# Patient Record
Sex: Female | Born: 1935 | Race: White | Hispanic: No | Smoking: Never smoker
Health system: Southern US, Community
[De-identification: ages and names within clinical notes are randomized; demographics above are authoritative.]

## PROBLEM LIST (undated history)

## (undated) DIAGNOSIS — I4821 Permanent atrial fibrillation: Secondary | ICD-10-CM

## (undated) DIAGNOSIS — I10 Essential (primary) hypertension: Secondary | ICD-10-CM

## (undated) DIAGNOSIS — L97909 Non-pressure chronic ulcer of unspecified part of unspecified lower leg with unspecified severity: Secondary | ICD-10-CM

## (undated) DIAGNOSIS — N289 Disorder of kidney and ureter, unspecified: Secondary | ICD-10-CM

## (undated) DIAGNOSIS — M1990 Unspecified osteoarthritis, unspecified site: Secondary | ICD-10-CM

## (undated) DIAGNOSIS — R001 Bradycardia, unspecified: Secondary | ICD-10-CM

## (undated) DIAGNOSIS — F0390 Unspecified dementia without behavioral disturbance: Secondary | ICD-10-CM

## (undated) DIAGNOSIS — Z87442 Personal history of urinary calculi: Secondary | ICD-10-CM

## (undated) DIAGNOSIS — I83009 Varicose veins of unspecified lower extremity with ulcer of unspecified site: Secondary | ICD-10-CM

## (undated) DIAGNOSIS — I5189 Other ill-defined heart diseases: Secondary | ICD-10-CM

## (undated) DIAGNOSIS — I2510 Atherosclerotic heart disease of native coronary artery without angina pectoris: Secondary | ICD-10-CM

## (undated) DIAGNOSIS — K5900 Constipation, unspecified: Secondary | ICD-10-CM

## (undated) HISTORY — DX: Unspecified dementia, unspecified severity, without behavioral disturbance, psychotic disturbance, mood disturbance, and anxiety: F03.90

## (undated) HISTORY — PX: CHOLECYSTECTOMY: SHX55

## (undated) HISTORY — DX: Varicose veins of unspecified lower extremity with ulcer of unspecified site: I83.009

## (undated) HISTORY — DX: Permanent atrial fibrillation: I48.21

## (undated) HISTORY — DX: Bradycardia, unspecified: R00.1

## (undated) HISTORY — DX: Unspecified osteoarthritis, unspecified site: M19.90

## (undated) HISTORY — PX: ABDOMINAL HYSTERECTOMY: SHX81

## (undated) HISTORY — PX: LAMINECTOMY: SHX219

## (undated) HISTORY — DX: Non-pressure chronic ulcer of unspecified part of unspecified lower leg with unspecified severity: L97.909

## (undated) HISTORY — PX: APPENDECTOMY: SHX54

## (undated) HISTORY — DX: Other ill-defined heart diseases: I51.89

---

## 1998-09-06 ENCOUNTER — Emergency Department (HOSPITAL_COMMUNITY): Admission: EM | Admit: 1998-09-06 | Discharge: 1998-09-06 | Payer: Self-pay | Admitting: *Deleted

## 1999-01-15 ENCOUNTER — Encounter: Payer: Self-pay | Admitting: Family Medicine

## 1999-01-15 ENCOUNTER — Encounter: Admission: RE | Admit: 1999-01-15 | Discharge: 1999-01-15 | Payer: Self-pay | Admitting: Family Medicine

## 2001-06-14 ENCOUNTER — Encounter: Admission: RE | Admit: 2001-06-14 | Discharge: 2001-06-14 | Payer: Self-pay | Admitting: Cardiology

## 2001-06-14 ENCOUNTER — Ambulatory Visit (HOSPITAL_COMMUNITY): Admission: RE | Admit: 2001-06-14 | Discharge: 2001-06-14 | Payer: Self-pay | Admitting: Cardiology

## 2001-06-15 ENCOUNTER — Ambulatory Visit (HOSPITAL_COMMUNITY): Admission: RE | Admit: 2001-06-15 | Discharge: 2001-06-16 | Payer: Self-pay | Admitting: Cardiology

## 2001-06-19 ENCOUNTER — Emergency Department (HOSPITAL_COMMUNITY): Admission: EM | Admit: 2001-06-19 | Discharge: 2001-06-19 | Payer: Self-pay

## 2001-06-19 ENCOUNTER — Encounter: Payer: Self-pay | Admitting: Emergency Medicine

## 2001-11-04 ENCOUNTER — Ambulatory Visit (HOSPITAL_COMMUNITY): Admission: RE | Admit: 2001-11-04 | Discharge: 2001-11-04 | Payer: Self-pay | Admitting: Cardiology

## 2001-11-30 ENCOUNTER — Ambulatory Visit (HOSPITAL_COMMUNITY): Admission: RE | Admit: 2001-11-30 | Discharge: 2001-11-30 | Payer: Self-pay | Admitting: Cardiology

## 2001-11-30 ENCOUNTER — Encounter: Payer: Self-pay | Admitting: Cardiology

## 2003-06-22 ENCOUNTER — Encounter: Admission: RE | Admit: 2003-06-22 | Discharge: 2003-06-22 | Payer: Self-pay | Admitting: Family Medicine

## 2004-03-20 ENCOUNTER — Ambulatory Visit (HOSPITAL_COMMUNITY): Admission: RE | Admit: 2004-03-20 | Discharge: 2004-03-20 | Payer: Self-pay | Admitting: Cardiology

## 2004-04-03 ENCOUNTER — Inpatient Hospital Stay (HOSPITAL_COMMUNITY): Admission: RE | Admit: 2004-04-03 | Discharge: 2004-04-22 | Payer: Self-pay | Admitting: Cardiothoracic Surgery

## 2004-04-03 ENCOUNTER — Ambulatory Visit: Payer: Self-pay | Admitting: Physical Medicine & Rehabilitation

## 2004-04-03 ENCOUNTER — Encounter (INDEPENDENT_AMBULATORY_CARE_PROVIDER_SITE_OTHER): Payer: Self-pay | Admitting: Specialist

## 2004-04-03 HISTORY — PX: MITRAL VALVE REPLACEMENT: SHX147

## 2004-04-11 HISTORY — PX: PACEMAKER INSERTION: SHX728

## 2004-04-19 ENCOUNTER — Encounter (INDEPENDENT_AMBULATORY_CARE_PROVIDER_SITE_OTHER): Payer: Self-pay | Admitting: Cardiology

## 2004-10-14 ENCOUNTER — Encounter: Admission: RE | Admit: 2004-10-14 | Discharge: 2004-10-14 | Payer: Self-pay | Admitting: Family Medicine

## 2005-03-12 ENCOUNTER — Ambulatory Visit (HOSPITAL_COMMUNITY): Admission: RE | Admit: 2005-03-12 | Discharge: 2005-03-12 | Payer: Self-pay | Admitting: Cardiology

## 2005-09-17 ENCOUNTER — Ambulatory Visit (HOSPITAL_COMMUNITY): Admission: RE | Admit: 2005-09-17 | Discharge: 2005-09-17 | Payer: Self-pay | Admitting: Cardiology

## 2006-03-10 ENCOUNTER — Ambulatory Visit (HOSPITAL_COMMUNITY): Admission: RE | Admit: 2006-03-10 | Discharge: 2006-03-10 | Payer: Self-pay | Admitting: Cardiology

## 2006-05-07 ENCOUNTER — Ambulatory Visit: Payer: Self-pay | Admitting: Critical Care Medicine

## 2006-05-08 ENCOUNTER — Ambulatory Visit: Payer: Self-pay | Admitting: Cardiology

## 2006-11-12 ENCOUNTER — Ambulatory Visit: Payer: Self-pay | Admitting: Cardiovascular Disease

## 2006-11-12 ENCOUNTER — Ambulatory Visit: Payer: Self-pay | Admitting: Oncology

## 2006-11-12 ENCOUNTER — Inpatient Hospital Stay (HOSPITAL_COMMUNITY): Admission: EM | Admit: 2006-11-12 | Discharge: 2006-11-18 | Payer: Self-pay | Admitting: Emergency Medicine

## 2006-11-13 ENCOUNTER — Encounter (INDEPENDENT_AMBULATORY_CARE_PROVIDER_SITE_OTHER): Payer: Self-pay | Admitting: Cardiovascular Disease

## 2006-11-16 ENCOUNTER — Ambulatory Visit: Payer: Self-pay | Admitting: Vascular Surgery

## 2006-11-20 ENCOUNTER — Emergency Department (HOSPITAL_COMMUNITY): Admission: EM | Admit: 2006-11-20 | Discharge: 2006-11-20 | Payer: Self-pay | Admitting: Emergency Medicine

## 2007-02-22 ENCOUNTER — Ambulatory Visit (HOSPITAL_COMMUNITY): Admission: RE | Admit: 2007-02-22 | Discharge: 2007-02-22 | Payer: Self-pay | Admitting: Cardiology

## 2007-02-28 ENCOUNTER — Inpatient Hospital Stay (HOSPITAL_COMMUNITY): Admission: EM | Admit: 2007-02-28 | Discharge: 2007-03-03 | Payer: Self-pay | Admitting: Emergency Medicine

## 2008-06-21 ENCOUNTER — Emergency Department (HOSPITAL_COMMUNITY): Admission: EM | Admit: 2008-06-21 | Discharge: 2008-06-21 | Payer: Self-pay | Admitting: Emergency Medicine

## 2008-06-22 ENCOUNTER — Observation Stay (HOSPITAL_COMMUNITY): Admission: EM | Admit: 2008-06-22 | Discharge: 2008-06-22 | Payer: Self-pay | Admitting: Emergency Medicine

## 2010-02-07 ENCOUNTER — Emergency Department (HOSPITAL_COMMUNITY): Admission: EM | Admit: 2010-02-07 | Discharge: 2009-09-08 | Payer: Self-pay | Admitting: Emergency Medicine

## 2010-03-24 ENCOUNTER — Encounter: Payer: Self-pay | Admitting: Cardiothoracic Surgery

## 2010-05-13 ENCOUNTER — Emergency Department (HOSPITAL_COMMUNITY): Payer: Medicare Other

## 2010-05-13 ENCOUNTER — Inpatient Hospital Stay (HOSPITAL_COMMUNITY)
Admission: EM | Admit: 2010-05-13 | Discharge: 2010-05-17 | DRG: 556 | Disposition: A | Discharge status: Skilled Nursing Facility | Payer: Medicare Other | Attending: Internal Medicine | Admitting: Internal Medicine

## 2010-05-13 DIAGNOSIS — M545 Low back pain, unspecified: Secondary | ICD-10-CM | POA: Diagnosis present

## 2010-05-13 DIAGNOSIS — Z7901 Long term (current) use of anticoagulants: Secondary | ICD-10-CM

## 2010-05-13 DIAGNOSIS — Z950 Presence of cardiac pacemaker: Secondary | ICD-10-CM

## 2010-05-13 DIAGNOSIS — T45515A Adverse effect of anticoagulants, initial encounter: Secondary | ICD-10-CM | POA: Diagnosis present

## 2010-05-13 DIAGNOSIS — I10 Essential (primary) hypertension: Secondary | ICD-10-CM | POA: Diagnosis present

## 2010-05-13 DIAGNOSIS — A498 Other bacterial infections of unspecified site: Secondary | ICD-10-CM | POA: Diagnosis present

## 2010-05-13 DIAGNOSIS — Z954 Presence of other heart-valve replacement: Secondary | ICD-10-CM

## 2010-05-13 DIAGNOSIS — G8929 Other chronic pain: Secondary | ICD-10-CM | POA: Diagnosis present

## 2010-05-13 DIAGNOSIS — M7981 Nontraumatic hematoma of soft tissue: Principal | ICD-10-CM | POA: Diagnosis present

## 2010-05-13 DIAGNOSIS — N390 Urinary tract infection, site not specified: Secondary | ICD-10-CM | POA: Diagnosis present

## 2010-05-13 DIAGNOSIS — D649 Anemia, unspecified: Secondary | ICD-10-CM | POA: Diagnosis present

## 2010-05-13 DIAGNOSIS — I4891 Unspecified atrial fibrillation: Secondary | ICD-10-CM | POA: Diagnosis present

## 2010-05-13 LAB — DIFFERENTIAL
Basophils Absolute: 0 10*3/uL (ref 0.0–0.1)
Eosinophils Absolute: 0.1 10*3/uL (ref 0.0–0.7)
Eosinophils Relative: 1 % (ref 0–5)
Lymphocytes Relative: 14 % (ref 12–46)
Monocytes Absolute: 0.9 10*3/uL (ref 0.1–1.0)

## 2010-05-13 LAB — URINALYSIS, ROUTINE W REFLEX MICROSCOPIC
Bilirubin Urine: NEGATIVE
Nitrite: POSITIVE — AB
Protein, ur: NEGATIVE mg/dL
Urobilinogen, UA: 0.2 mg/dL (ref 0.0–1.0)

## 2010-05-13 LAB — CBC
HCT: 30.5 % — ABNORMAL LOW (ref 36.0–46.0)
MCHC: 32.8 g/dL (ref 30.0–36.0)
MCV: 90 fL (ref 78.0–100.0)
Platelets: 177 10*3/uL (ref 150–400)
RDW: 14.9 % (ref 11.5–15.5)
WBC: 7.2 10*3/uL (ref 4.0–10.5)

## 2010-05-13 LAB — BASIC METABOLIC PANEL
BUN: 32 mg/dL — ABNORMAL HIGH (ref 6–23)
CO2: 23 mEq/L (ref 19–32)
Calcium: 8.9 mg/dL (ref 8.4–10.5)
GFR calc non Af Amer: 56 mL/min — ABNORMAL LOW (ref >60)
Glucose, Bld: 148 mg/dL — ABNORMAL HIGH (ref 70–99)
Sodium: 136 mEq/L (ref 135–145)

## 2010-05-14 LAB — CBC
HCT: 29.3 % — ABNORMAL LOW (ref 36.0–46.0)
HCT: 27 % — ABNORMAL LOW (ref 36.0–46.0)
Hemoglobin: 9.6 g/dL — ABNORMAL LOW (ref 12.0–15.0)
MCHC: 31.9 g/dL (ref 30.0–36.0)
Platelets: 154 10*3/uL (ref 150–400)
RBC: 3.22 MIL/uL — ABNORMAL LOW (ref 3.87–5.11)
RDW: 14.8 % (ref 11.5–15.5)
WBC: 5.4 10*3/uL (ref 4.0–10.5)
WBC: 6.2 10*3/uL (ref 4.0–10.5)

## 2010-05-14 LAB — PROTIME-INR
INR: 2.67 — ABNORMAL HIGH (ref 0.00–1.49)
Prothrombin Time: 28.5 seconds — ABNORMAL HIGH (ref 11.6–15.2)

## 2010-05-14 LAB — COMPREHENSIVE METABOLIC PANEL
ALT: 15 U/L (ref 0–35)
Alkaline Phosphatase: 80 U/L (ref 39–117)
CO2: 23 mEq/L (ref 19–32)
Chloride: 106 mEq/L (ref 96–112)
GFR calc non Af Amer: 55 mL/min — ABNORMAL LOW (ref >60)
Glucose, Bld: 116 mg/dL — ABNORMAL HIGH (ref 70–99)
Potassium: 4.4 mEq/L (ref 3.5–5.1)
Sodium: 138 mEq/L (ref 135–145)
Total Bilirubin: 0.8 mg/dL (ref 0.3–1.2)
Total Protein: 5.9 g/dL — ABNORMAL LOW (ref 6.0–8.3)

## 2010-05-14 LAB — TSH: TSH: 0.488 u[IU]/mL (ref 0.350–4.500)

## 2010-05-14 LAB — IRON AND TIBC
Iron: 34 ug/dL — ABNORMAL LOW (ref 42–135)
Saturation Ratios: 13 % — ABNORMAL LOW (ref 20–55)
UIBC: 224 ug/dL

## 2010-05-14 LAB — FOLATE: Folate: 14.6 ng/mL

## 2010-05-14 LAB — MAGNESIUM: Magnesium: 2.2 mg/dL (ref 1.5–2.5)

## 2010-05-14 LAB — FERRITIN: Ferritin: 156 ng/mL (ref 10–291)

## 2010-05-15 ENCOUNTER — Inpatient Hospital Stay (HOSPITAL_COMMUNITY): Payer: Medicare Other

## 2010-05-15 LAB — MAGNESIUM: Magnesium: 2.1 mg/dL (ref 1.5–2.5)

## 2010-05-15 LAB — URINE CULTURE: Culture  Setup Time: 201203122324

## 2010-05-15 LAB — PREPARE FRESH FROZEN PLASMA: Unit division: 0

## 2010-05-15 LAB — PROTIME-INR
INR: 2.45 — ABNORMAL HIGH (ref 0.00–1.49)
Prothrombin Time: 26.7 seconds — ABNORMAL HIGH (ref 11.6–15.2)

## 2010-05-15 LAB — BASIC METABOLIC PANEL
Calcium: 8.6 mg/dL (ref 8.4–10.5)
GFR calc Af Amer: 52 mL/min — ABNORMAL LOW (ref >60)
GFR calc non Af Amer: 43 mL/min — ABNORMAL LOW (ref >60)
Glucose, Bld: 102 mg/dL — ABNORMAL HIGH (ref 70–99)
Potassium: 4.2 mEq/L (ref 3.5–5.1)
Sodium: 139 mEq/L (ref 135–145)

## 2010-05-15 LAB — CBC
Hemoglobin: 8.5 g/dL — ABNORMAL LOW (ref 12.0–15.0)
Platelets: 139 10*3/uL — ABNORMAL LOW (ref 150–400)
RBC: 2.86 MIL/uL — ABNORMAL LOW (ref 3.87–5.11)
WBC: 4.7 10*3/uL (ref 4.0–10.5)

## 2010-05-16 LAB — CBC
HCT: 26.1 % — ABNORMAL LOW (ref 36.0–46.0)
MCHC: 32.2 g/dL (ref 30.0–36.0)
Platelets: 139 10*3/uL — ABNORMAL LOW (ref 150–400)
RDW: 14.9 % (ref 11.5–15.5)
WBC: 5.1 10*3/uL (ref 4.0–10.5)

## 2010-05-16 LAB — PROTIME-INR: INR: 2.13 — ABNORMAL HIGH (ref 0.00–1.49)

## 2010-05-17 LAB — PROTIME-INR: Prothrombin Time: 26.3 seconds — ABNORMAL HIGH (ref 11.6–15.2)

## 2010-05-17 LAB — HEMOGLOBIN AND HEMATOCRIT, BLOOD: HCT: 24.9 % — ABNORMAL LOW (ref 36.0–46.0)

## 2010-05-18 LAB — CROSSMATCH

## 2010-05-19 LAB — URINE MICROSCOPIC-ADD ON

## 2010-05-19 LAB — URINALYSIS, ROUTINE W REFLEX MICROSCOPIC
Bilirubin Urine: NEGATIVE
Glucose, UA: NEGATIVE mg/dL
Protein, ur: NEGATIVE mg/dL
Specific Gravity, Urine: 1.028 (ref 1.005–1.030)

## 2010-05-19 LAB — POCT I-STAT, CHEM 8
HCT: 34 % — ABNORMAL LOW (ref 36.0–46.0)
Hemoglobin: 11.6 g/dL — ABNORMAL LOW (ref 12.0–15.0)
Potassium: 4.4 mEq/L (ref 3.5–5.1)
Sodium: 143 mEq/L (ref 135–145)
TCO2: 24 mmol/L (ref 0–100)

## 2010-05-19 LAB — APTT: aPTT: 34 seconds (ref 24–37)

## 2010-05-19 LAB — BRAIN NATRIURETIC PEPTIDE: Pro B Natriuretic peptide (BNP): 358 pg/mL — ABNORMAL HIGH (ref 0.0–100.0)

## 2010-05-19 LAB — URINE CULTURE

## 2010-05-19 LAB — PROTIME-INR: Prothrombin Time: 23.7 seconds — ABNORMAL HIGH (ref 11.6–15.2)

## 2010-05-23 NOTE — Discharge Summary (Signed)
NAME:  Sherry Howell, Sherry Howell         ACCOUNT NO.:  [id number]  MEDICAL RECORD NO.:  [id number]           PATIENT TYPE:  I  LOCATION:  5001                         FACILITY:  [facility]  PHYSICIAN:  Jeoffrey Massed, MD    DATE OF BIRTH:  [birth year]  DATE OF ADMISSION:  05/13/2010 DATE OF DISCHARGE:                        DISCHARGE SUMMARY - REFERRING   PRIMARY CARE PRACTITIONER:  Elsworth Soho, MD  PRIMARY CARDIOLOGIST:  Armanda Magic, MD  PRIMARY DISCHARGE DIAGNOSES: 1. Intramuscular hematoma in the distal right iliopsoas muscle and the     right pectineus muscle. 2. Anemia. 3. Urinary tract infection.  SECONDARY DISCHARGE DIAGNOSES: 1. Atrial fibrillation. 2. History of mechanical mitral valve replacement, on chronic Coumadin     therapy. 3. History of sick sinus syndrome. 4. Status post permanent pacemaker implantation. 5. Hypertension.  DISCHARGE MEDICATIONS:  The discharge medications are as follows: 1. Ceftin 500 mg 1 tablet p.o. twice daily for 1 more day and to stop     after May 18, 2010. 2. Colace 100 mg 1 tablet p.o. twice daily. 3. Percocet 5/325 one to two tablets q.4 h. p.r.n. 4. Protonix 40 mg 1 tablet daily. 5. MiraLax 17 g p.o. daily. 6. Lasix 40 mg 1 tablet every morning. 7. Potassium chloride 10 mEq 1 tablet every morning. 8. Metoprolol 100 mg 1 tablet p.o. every morning. 9. Visine 1 drop in both eyes daily. 10.Warfarin 5 mg 1 tablet p.o. every evening, to titrate to keep INR     as close to 2.5 as possible.  CONSULTATIONS:  Lubertha Basque. Jerl Santos, MD, from Orthopedics.  BRIEF HISTORY OF PRESENT ILLNESS:  The patient is an extremely pleasant 77 year old female with a history of chronic Coumadin therapy secondary to chronic atrial fibrillation and mechanical mitral valve who comes into the hospital on May 13, 2010, for right hip pain.  Per the history obtained apparently a few nights ago prior to admission, the patient almost had a fall and was trying to  break the fall, she strained her muscle in the right hip and heard a popping noise.  Following that, she had excruciating pain.  She was then brought to the ED 2 days later where a CT scan of the pelvis showed a possible hematoma in the right iliopsoas and pectineus muscle.  She was then admitted to the Hospitalist Service for further evaluation and treatment.  For further details, please see the history and physical that was dictated by Dr. Toniann Fail on admission.  PERTINENT RADIOLOGICAL STUDIES: 1. CT of the pelvis without contrast done on May 13, 2010, shows     muscle strain and hemorrhage of the right iliopsoas and pectineus     muscles of the right hip.  No discrete right hip fracture. 2. Repeat CT of the right hip without contrast done on May 15, 2010,     shows an unchanged hematoma when compared to the prior exam.  DISCHARGE LABORATORY DATA: 1. Hemoglobin on discharge is 8.2. 2. INR on discharge is 2.4. 3. Urine culture was positive for E. coli, which was pansensitive.  BRIEF HOSPITAL COURSE: 1. Right thigh/hip hematoma:  This was probably secondary to muscle  strain and a spontaneous hematoma given the fact that the patient     was on Coumadin.  She was then admitted to the Hospitalist Service     for further evaluation and observation.  Her INR on admission was     2.67.  Initially, her Coumadin was held, but given the fact that     she has mitral valve it was restarted.  With continued observation     of the patient, she seemed to be doing better.  Her pain was slowly     getting better.  She was evaluated by Physical Therapy and     recommended that she be transferred to a skilled nursing facility     for rehab.  Orthopedics was also consulted.  A repeat CT of the     right hip area was done, which showed an unchanged bleed.  Her     hemoglobin levels have actually been more or less stable for the     past 3 days.  On examination, there is no enlargement of her  thigh,     no overlying erythema or tenderness.  She has strong pulses in     dorsalis pedis artery bilaterally.  At this point in time, the plan     is to try and keep her INR as close to 2.5 as possible for the next     few weeks.  To continue monitoring her hemoglobin and INR almost at     least 2-3 times a week.  She will continue to need physical     therapy.  If the patient develops worsening right hip/height pain     or thigh swelling, she will need to be evaluated promptly to make     sure that the hematoma is not expanding.  However, at this point in     time, for the last 4 days that she has been here in the hospital,     the pain has actually gotten better.  Her INR on the day of     discharge is 2.4.  She has worked with physical therapy and has     actually taken a few steps today as well. 2. Chronic anticoagulation secondary to mechanical mitral valve and     atrial fibrillation:  Please note that this patient has a     mechanical mitral valve and her goal INR should be between 2.5-3.5.     However, given her hematoma at this point for the next few weeks at     least, we would like to see INR as close to 2.5 as possible.  Given     the fact that she has a hematoma in the right thigh, it is     suggested that the patient's INR be checked at least 2-3 times a     week to make sure that it has not gone to high or gone to low.     Again, our goal is to keep it as close to 0.5 as possible until the     situation stabilizes. 3. Hypertension:  This was stable.  She is to continue her usual     medications. 4. UTI:  This was secondary to E. coli and she will complete 5 days of     total antibiotics.  She was on Rocephin and now will be     transitioned to oral Ceftin on discharge.  DISPOSITION:  The patient at this time is considered stable to  be discharged to a skilled nursing facility.  FOLLOWUP INSTRUCTIONS: 1. The patient will need INR monitoring at least 2-3 times a  week.     The goal is to keep it as close to 2.5 as possible. 2. The patient will also need hemoglobin and hematocrit monitoring     hopefully at least 2 times a week. 3. If the patient were to develop worsening right thigh pain or high     swelling, she will need to be promptly evaluated by a physician and     may be required transfer to the emergency department as well as     possible. 4. The patient will need to follow up with the primary care     practitioner within 1 week upon discharge from the skilled nursing     facility. 5. The patient will also need to follow up with Dr. Mayford Knife, her     primary cardiologist within 1 week upon discharge from the     facility.  Total time spent equals 45 minutes.     Jeoffrey Massed, MD     SG/MEDQ  D:  05/17/2010  T:  05/17/2010  Job:  [id number]  cc:   L. Lupe Carney, M.D. Armanda Magic, M.D. Lubertha Basque Jerl Santos, M.D.  Electronically Signed by Jeoffrey Massed  on 05/23/2010 08:33:00 PM

## 2010-06-03 NOTE — H&P (Signed)
NAME:  Sherry Howell, Sherry Howell         ACCOUNT NO.:  [id number]  MEDICAL RECORD NO.:  [id number]           PATIENT TYPE:  LOCATION:                                 FACILITY:  PHYSICIAN:  Eduard Clos, MDDATE OF BIRTH:  1925/04/07  DATE OF ADMISSION:  05/13/2010 DATE OF DISCHARGE:                             HISTORY & PHYSICAL   PRIMARY CARE PHYSICIAN:  L. Lupe Carney, MD  PRIMARY CARDIOLOGIST:  Armanda Magic, MD  CHIEF COMPLAINT:  Right hip pain.  HISTORY OF PRESENT ILLNESS:  An 77 year old female with known history of mechanical mitral valve replacement on Coumadin, history of class IV CHF, history of atrial fibrillation, sick sinus syndrome status post pacemaker placement, history of hypertension, chronic low back pain, was brought into ER after the patient has having severe pain in the right hip.  The patient states that 2 nights ago, the patient was trying to lie on the bed when she heard a pop in the right hip after which the patient had persistent pain in the right hip and increasing in intensity.  The patient came to the ER and x-rayed and eventually had CT pelvis without contrast which showed muscle strain and hemorrhage of the right iliopsoas and pectineus muscles on the right hip.  No discrete hip fracture.  The patient has been admitted for pain management and further observation as the patient is on Coumadin.  The patient denies any chest pain, shortness of breath, nausea, vomiting, abdominal pain, dysuria, discharge, diarrhea, any headache, or visual symptoms.  The patient does have intense pain when she tries to move her right hip.  At this time, the patient's right lower extremity does not show any signs of any compartment syndrome.  PAST MEDICAL HISTORY: 1. History of mechanical mitral valve replacement on Coumadin. 2. History of sick sinus syndrome. 3. Tachy-brady syndrome status post pacemaker placement. 4. History of hypertension. 5. History of  class IV CHF.  PAST SURGICAL HISTORY:  Mitral valve replacement with maze procedures status post pacemaker placement for sick sinus syndrome with history of low back surgery.  MEDICATIONS ON ADMISSION: 1. The patient is on oxycodone/acetaminophen 5/325 p.o. half a tablet,     1 tablet q.4 p.r.n. as needed. 2. Visine over-the-counter both eyes. 3. Warfarin. 4. Metoprolol XL 100 mg p.o. 1 tablet daily. 5. Stool softener. 6. Klor-Con. 7. Furosemide.  ALLERGIES:  MORPHINE.  SOCIAL HISTORY:  The patient does not smoke cigarette, drink alcohol, or use illegal drugs.  REVIEW OF SYSTEMS:  As per history of present illness, nothing else significant.  PHYSICAL EXAMINATION:  GENERAL:  The patient examined at bedside, not in acute distress. VITAL SIGNS:  Blood pressure is 108/50, pulse is 80 per minute, temperature 98.7, respiration is 20 per minute, O2 sat is 95%. HEENT:  Anicteric.  No pallor.  No facial asymmetry.  Tongue is midline. No neck rigidity. CHEST:  Bilateral air entry present.  No crepitation. HEART:  S1 and S2 heard. ABDOMEN:  Soft, nontender.  Bowel sounds heard. CNS:  The patient is alert, awake, oriented to time, place, and person. Moves upper and lower extremities but the right lower extremity movement is  severe pain. EXTREMITIES:  A 1-2+ edema in both lower extremities with chronic skin changes in both lower extremity and the patient is not able to move her right lower extremity with intense pain in doing so.  I am not able to look at the posterior aspect of her extremities because in moving she has intense pain.  LABORATORY DATA:  EKG shows paced rhythm.  X-ray of the pelvis shows positive, no definite acute fracture found about the pelvis.  X-ray right femur shows no acute fracture or dislocation identified about the right femur.  Degenerative changes at right knee.  CT pelvis without contrast media shows muscle strains, hemorrhage of the right iliopsoas and  pectineus muscle of the right hip.  No discrete hip fracture.  CBC, WBC 7.2, hemoglobin is 10, hematocrit 30.5, platelets 177,000.  PT and INR is 28.5 and 2.67.  Basic metabolic panel, sodium 136, potassium 4.7, chloride 105, carbon dioxide 23, glucose 148, BUN 32, creatinine 0.9, calcium 8.9.  UA shows positive nitrate and leukocyte, wbc 3-6, squamous cells are rate, and bacteria few.  ASSESSMENT: 1. Right hip pain with muscle strains and hemorrhage of the right     iliopsoas and pectineus muscle of the right hip.  No definite     fracture. 2. Urinary tract infection. 3. Mechanical mitral valve on Coumadin. 4. Chronic anemia. 5. Congestive heart failure, stage IV. 6. History of pacemaker placement for tachycardia-bradycardia     syndrome. 7. Chronic low back pain. 8. History of hypertension.  PLAN: 1. At this time, admit the patient to medical floor. 2. For right hip pain with hemorrhage, at this time we are going to     closely observe and have the patient be here on fentanyl and     Percocet.  The patient will need to be observed for any developing     compartment syndrome, for which we will be measuring her right     thigh.  We are going to continue the Coumadin as the patient has     mechanical valve which pharmacy will be dosing.  We are going to     type and cross match 2 units PRBC and FFP.  If there is any     worsening of her hematoma, at that time we need to consult     Cardiology and see what is     most appropriate and safe for the patient. 3. For UTI, the patient will be on ceftriaxone. 4. For CHF, we will continue present medication and further     recommendation as condition evolves.     Eduard Clos, MD     ANK/MEDQ  D:  05/14/2010  T:  05/14/2010  Job:  [id number]  cc:   L. Lupe Carney, M.D. Armanda Magic, M.D.  Electronically Signed by Midge Minium MD on 06/03/2010 07:45:05 AM

## 2010-06-12 LAB — POCT I-STAT, CHEM 8
Calcium, Ion: 1.19 mmol/L (ref 1.12–1.32)
Chloride: 108 mEq/L (ref 96–112)
Chloride: 108 mEq/L (ref 96–112)
HCT: 32 % — ABNORMAL LOW (ref 36.0–46.0)
HCT: 35 % — ABNORMAL LOW (ref 36.0–46.0)
Hemoglobin: 11.9 g/dL — ABNORMAL LOW (ref 12.0–15.0)
Potassium: 4.2 mEq/L (ref 3.5–5.1)
Potassium: 4.2 mEq/L (ref 3.5–5.1)
Sodium: 142 mEq/L (ref 135–145)
Sodium: 143 mEq/L (ref 135–145)

## 2010-07-16 NOTE — H&P (Signed)
NAME:  Sherry Howell, Sherry Howell         ACCOUNT NO.:  [id number]   MEDICAL RECORD NO.:  [id number]          PATIENT TYPE:  EMS   LOCATION:  MAJO                         FACILITY:  [facility]   PHYSICIAN:  Christell Faith, MD   DATE OF BIRTH:  May 14, 1925   DATE OF ADMISSION:  11/12/2006  DATE OF DISCHARGE:                              HISTORY & PHYSICAL   ATTENDING CARDIOLOGIST:  Armanda Magic, M.D.   PRIMARY CARE PHYSICIAN:  L. Lupe Carney, M.D.   CHIEF COMPLAINT:  Weight gain.   HISTORY OF PRESENT ILLNESS:  This is a 77 year old white female with a  history of rheumatic heart disease, status post mitral valve replacement  in 2006, who reports a 10-pound weight gain over the past week.  She has  had no changes in her diuretic therapy and is unsure how much salt she  takes in, but has not had any major dietary changes recently.  She has  mild shortness of breath, which is at its baseline.  She chronically  sleeps upright in a recliner and this has not changed.  She has noted  swelling in her hands, feet and abdominal fullness.  She reports early  satiety and a pressure in her abdomen that moves up into her lower  chest, especially with bending over at the waste.  She also notes racing  heart for the past month or so.  She denies syncope or presyncope.   PAST MEDICAL HISTORY:  1. Mitral stenosis, status post mechanical mitral valve replacement in      February 2006.  2. Ejection fraction was 60% at the time of mitral valve surgery in      2006 and while she did have congestive heart failure prior to      surgery, there is no report of congestive heart failure since her      mitral valve surgery.  3. Atrial fibrillation treated with operative Maze procedure.  4. Tachybrady syndrome treated with permanent pacemaker in February      2006 by Dr. Amil Amen.  Device is [employer].  5. Hypertension.  6. Transient ischemic attack x2.  7. Severe diffuse arthritis with history of spinal fusion  surgery.  8. Coronary arteries are normal on diagnostic catheterization in 2006.   ALLERGIES:  Morphine causes itch, but is not a true allergy.   MEDICATIONS:  1. Amiodarone 100 mg p.o. daily.  2. Coumadin 2.5 alternating with 5 mg p.o. daily.  3. Lasix 40 mg daily.  4. Toprol XL 25 mg p.o. daily.  5. Potassium chloride 10 mEq p.o. daily.   SOCIAL HISTORY:  She lives alone in [place].  She is widowed.  She  has never smoked.  She is a nondrinker.   FAMILY HISTORY:  She is accompanied today by her daughter Dois Davenport.  Her  parents had arthritis and there is no premature or sudden cardiac death  in her family.   REVIEW OF SYSTEMS:  Positive for weight gain, shortness of breath,  dyspnea on exertion, orthopnea, edema, palpitations, weakness,  arthralgia, joint pain, memory loss.  Otherwise, the balance of 14  systems is reviewed in detail and is  negative.   PHYSICAL EXAMINATION:  VITAL SIGNS:  Temperature 97.4, pulse 120,  respiratory rate 20, blood pressure 162/102 initially, repeat 109/74.  Saturation 97% on 2 liters.  GENERAL:  This is an older white female wearing glasses, in no acute  distress.  She is in no respiratory distress.  Very pleasant.  HEENT:  Pupils round and reactive.  Sclera are clear.  Mucous membranes  are moist.  NECK:  Supple.  There is 2 cm of JVD.  No carotid bruits.  No cervical  lymphadenopathy.  No thyromegaly.  LUNGS:  Clear to auscultation bilaterally with no wheezing or rales.  CARDIAC:  The rate is tachycardiac.  There is a soft click for the  second heart sound.  The first heart sound is normal.  There is a  vibratory pulmonary flow murmur clearly louder with inspiration and  softer with expiration.  This is best heard at the base of the heart.  ABDOMEN:  Soft, nontender, mildly distended.  No rebound or guarding.  Normal bowel sounds.  EXTREMITIES:  Reveal 1+ edema in the feet.  No edema in the hands.  2+  dorsalis pedis and radial pulses  bilaterally.  NEUROLOGIC:  She is awake, alert and oriented x3.  Extraocular movements  are intact.  Facial expressions are symmetric.  5/5 strength in all four  extremities.  She self reports mild short-term memory loss.   IMAGING STUDIES:  Chest x-ray shows stable cardiomegaly with a stable  small right pleural effusion.  Electrocardiogram shows a rate of 120  beats a minute with a wide complex in a ventricular paced rhythm.  The  underlying atrial rhythm cannot be discerned.   LABORATORY DATA:  White blood cells 7.4, hemoglobin 11.9, platelets 238.  Sodium 138, potassium 3.8, bicarbonate 24, BUN 53, creatinine 2.4,  glucose 108, CK-MB 2.3, troponin I less than 0.05.  INR 3.6.  BNT 527.   IMPRESSION:  77 year old white female with clinical congestive heart  failure exacerbation, tachycardia and renal failure.  She has a history  of mitral valve replacement and atrial fibrillation.   PLAN:  1. Admit to telemetry, Dr. Mayford Knife.  2. Daily weights, strict in's and out's, and Lasix 40 mg IV b.i.d.,      being mindful of electrolyte loss.  3. We will rule out myocardial infarction by cycling serial EKGs and      cardiac enzymes.  4. Check transthoracic echocardiogram to evaluate left ventricular      function, valvular status and rule out effusion.  5. Check TSH and liver tests.  6. Her baseline creatinine is unknown to me.  This may represent acute      on chronic renal failure.  Will ask for her old charts to try to      clear this up.  In the meantime, we will check urine electrolytes,      renal ultrasound and consider renal consult.  We will withhold ACE      inhibitor therapy until her renal function is more clearly defined.  7. Tachycardia rhythm is ventricular paced.  Underlying atrial rhythm      is not clear.  Will ask for [employer] device interrogation tomorrow      morning.  Specific questions would be to rule out pacemaker      mediated tachycardia and evaluate for  underlying atrial      tachycardia.  Pending the results of the interrogation, we will      consider increasing her beta  blocker if indicated.  8. She has been followed by pulmonologist for diminished diffusion      capacity; however, it is recommended by      the pulmonologist that she can continue amiodarone and we will      continue that medicine at this time.  9. Continue Coumadin at her home dosing.  Should she need an invasive      procedure, we would bridge with heparin.      Christell Faith, MD  Electronically Signed     NDL/MEDQ  D:  11/12/2006  T:  11/13/2006  Job:  [id number]

## 2010-07-16 NOTE — Consult Note (Signed)
NAME:  Sherry Howell, Sherry Howell         ACCOUNT NO.:  [id number]   MEDICAL RECORD NO.:  [id number]          PATIENT TYPE:  INP   LOCATION:  6523                         FACILITY:  [facility]   PHYSICIAN:  Maree Krabbe, M.D.DATE OF BIRTH:  02-06-1926   DATE OF CONSULTATION:  DATE OF DISCHARGE:                                 CONSULTATION   REPORT TITLE:  RENAL CONSULT.   PRIMARY DOCTOR:  Dr. Armanda Magic.   REASON FOR CONSULTATION:  Elevated creatinine.   HISTORY OF PRESENT ILLNESS:  The patient is a 77 year old white female  with history of hypertension, DJD, TIA, and severe mitral stenosis with  onset as a teenager.  In 2006, she developed class IV heart failure and  atrial fibrillation and underwent a mitral valve replacement and a Maze  procedure.  She also had a pacemaker placed shortly thereafter for tachy-  brady syndrome.  She had a EF of 60% at the time of surgery in 2006 and  has not had any clinical heart failure since then but presented at this  time with a 10-pound weight gain, shortness of breath with exertion, and  abdominal and ankle swelling bilaterally with a diagnosis of presumptive  congestive heart failure.  She had a creatinine of 2.4 an admission and  CMP done on the next day creatinine was 1.9; we do not have any old  creatinines apparently.   Other history:  She had decreased diffusion capacity and seen by  pulmonologist earlier this year question of amiodarone toxicity.  However, and the repeat diffusion capacity was unchanged and a chest CAT  scan did not show any fibrotic or infiltrative changes, and it was felt  the patient did not have amiodarone toxicity at that time.   The patient since admission has been treated with Lasix 40 IV b.i.d. and  subjectively has had significant diuresis and feels that her swelling is  better; she has not been out of bed yet to check how she does with  ambulating.  A renal ultrasound was done that showed mildly atrophic  kidneys bilaterally with cortical thinning, 9.1 cm on the right and 9.9  cm on the left.  Urinalysis is pending and the patient denies any prior  history of renal failure.  She did have a left ureterovesical junction  stone in 2003 with mild left hydronephrosis.  There is no hydronephrosis  on today's study.   PAST MEDICAL HISTORY:  1. Hypertension.  2. Significant DJD of spine.  3. Lacunar infarct on old CT.  4. Clinical history of TIA x2.  5. Mitral stenosis from childhood, treated with valve replacement as      below.  6. History of AFib, status post Mays procedure.  7. History of tachybrady syndrome, status post permanent pacemaker.   ALLERGIES:  MORPHINE CAUSES ITCH.   MEDICATIONS ON ADMISSION:  1. Amiodarone 100 daily.  2. Coumadin.  3. Lasix 40 p.o. daily.  4. Toprol XL 25 daily.  5. KCl.   SOCIAL HISTORY:  Widowed, lives in [place] alone.  She has family who  lives locally.  She has no tobacco or alcohol use.  FAMILY HISTORY:  No renal disease.   REVIEW OF SYSTEMS:  CONSTITUTIONAL:  Denies fever, chills, weight loss,  or night sweats.  ENT:  Denies hearing loss, visual change, sore throat,  or difficulty swallowing.  CARDIORESPIRATORY:  Denies orthopnea, PND,  productive cough, chest pain, or hemoptysis.  GI:  Denies nausea,  vomiting, abdominal pain.  GU:  Denies any dysuria or difficulty  voiding.  She is currently has a Foley catheter in place.  MUSCULOSKELETAL:  She ambulates with a walker.  She has some chronic  arthritis in her hands and back.  NEUROLOGIC:  No focal numbness or  weakness.   PHYSICAL EXAMINATION:  VITAL SIGNS:  Temperature 97.4, blood pressure  140/80, O2 sat 97% on 2 L, heart rate 80, respirations 20.  GENERAL:  This is an elderly obese white female; when asked she is in no  acute distress.  SKIN:  Warm and dry without rash.  HEENT:  PERRL, EOMI.  Throat is clear.  NECK:  Supple.  The she has a bounding aortic pulse, with bounding   carotid pulsation.  I do not see any jugular venous distention.  There  is no lymphadenopathy.  CHEST:  Clear throughout bilaterally.  CARDIAC:  Regular rate and rhythm.  I seem to hear an extra heart sound  which sounds like an S3, but it may be a split 1st or 2nd heart sound.  ABDOMEN:  Soft and nontender.  Obese.  No organomegaly.  EXTREMITIES:  1+ edema in the ankles and feet.  Otherwise no edema.  NEUROLOGICAL:  Alert alert and oriented x3.  Strength is reasonable in  all four extremities   LABORATORY DATA:  1. White count 7000, hemoglobin 11, sodium 138, patient 3.8,      creatinine 1.9 today.  Cardiac enzymes negative.  INR 3.6.  BNP      527.  2. Chest x-ray:  A small right pleural effusion versus chronic pleural      thickening and stable cardiomegaly.  No edema.   IMPRESSION:  1. Renal insufficiency:  Suspect this is chronic with ultrasound      findings.  She has some renal atrophy bilaterally which could be      due to chronic renovascular disease and/or hypertensive      nephrosclerosis.  She has a remote history of stone-related      hydronephrosis, but no signs of obstruction on today's scan.  We      will initiate workup with a urinalysis and ultimately I think it      would be a good for her to have a renal Doppler ultrasound for      renal arteries, which would be best done as an outpatient after      discharge.  We will check aspect SPEP and UPEP also.  If she has      significant proteinuria, we will broaden the workup.  2. Volume overload with edema and weight gain: No overt pulmonary      edema; undergoing diuresis.  3. History of mitral valve replacement due to mitral stenosis in 2006.  4. History angiofibromas status post Mays procedure and permanent      pacemaker in 2006.  5. Hypertension on Toprol and Lasix only.  6. Degenerative joint disease:  No significant NSAID use.      Maree Krabbe, M.D.  Electronically Signed     RDS/MEDQ  D:   11/13/2006  T:  11/14/2006  Job:  [id number]

## 2010-07-16 NOTE — H&P (Signed)
NAME:  Sherry Howell, Sherry Howell         ACCOUNT NO.:  [id number]   MEDICAL RECORD NO.:  [id number]          PATIENT TYPE:  INP   LOCATION:  5506                         FACILITY:  [facility]   PHYSICIAN:  Deirdre Peer. Polite, M.D. DATE OF BIRTH:  05-11-1925   DATE OF ADMISSION:  02/28/2007  DATE OF DISCHARGE:                              HISTORY & PHYSICAL   No dictation.      Deirdre Peer. Polite, M.D.  Electronically Signed     RDP/MEDQ  D:  02/28/2007  T:  02/28/2007  Job:  [id number]

## 2010-07-16 NOTE — H&P (Signed)
NAME:  Sherry Howell, Sherry Howell         ACCOUNT NO.:  [id number]   MEDICAL RECORD NO.:  [id number]          PATIENT TYPE:  INP   LOCATION:  1823                         FACILITY:  [facility]   PHYSICIAN:  Deirdre Peer. Polite, M.D. DATE OF BIRTH:  1926-02-15   DATE OF ADMISSION:  02/28/2007  DATE OF DISCHARGE:                              HISTORY & PHYSICAL   CHIEF COMPLAINT:  Fall with left buttocks pain.   HISTORY OF PRESENT ILLNESS:  A 77 year old female who had an accidental  fall on Christmas day, as a result, developed pain and hematoma as she  is on Coumadin.  She presented to the ED with a pain, became nonverbal.  In the ED, she had obvious extensive hematoma in the left gluteal area  which is confirmed by CT.  The patient had labs that showed INR that was  elevated at 4.3.  Electrolytes were within normal limits.  Hemoglobin  8.5.  Admission was deemed necessary for further evaluation and  treatment.  The patient denies chest pain, shortness of breath.  It was  an accidental fall.  One of her grandchildren jumped into her arms.  She  stumble and fell.   PAST MEDICAL HISTORY:  1. Pacemaker secondary to tachybrady syndrome 2006.  2. Status post mitral valve repair in 2006.  3. Cholecystectomy 1974.  4. Hysterectomy 1966.   CURRENT MEDICATIONS:  1. Toprol XL 50 mg.  2. Lasix 40 mg.  3. Klor-Con 20 mg.  4. Coumadin 5 mg alternating with 2.5 mg.  5. Pacerone 250 mg.   SOCIAL HISTORY:  Negative for tobacco, alcohol or drugs.   PAST SURGICAL HISTORY:  As stated in the past medical history.   ALLERGIES:  MORPHINE CAUSES PRURITUS.   FAMILY HISTORY:  Mother deceased from leukemia.  Father deceased from  MI.   REVIEW OF SYSTEMS:  As stated in the HPI.   PHYSICAL EXAMINATION:  GENERAL:  The patient is alert and oriented x3.  VITAL SIGNS:  Temperature 97.9, blood pressure 130/68, pulse 93,  respiratory rate 20, saturating 99%.  HEENT:  Unremarkable.  CHEST:  Clear without rales or  rhonchi.  CARDIOVASCULAR:  Regular.  No S3.  ABDOMEN:  Soft, nontender.  EXTREMITIES:  Significant for extensive hematoma in the left gluteal  area.  There is also a small break in the skin which is currently  covered by [employer].   LABORATORY DATA:  As stated in the HPI.  CT of abdomen and pelvis large  hematoma on the left.  CBC:  White count 10.3, hemoglobin 8.5, B-met  within normal limits.  INR 4.3.   ASSESSMENT:  1. Accidental fall which resulted in large left hip hematoma.  2. Anemia secondary to subcu bleeding.  3. Elevated I&R 4.3.  4. Status post mitral valve repair.  5. Status post pacer.   RECOMMENDATIONS:  Recommend patient be admitted to a telemetry floor  bed.  Will hold the patient's Coumadin.  FFP will be given.  Will have  serial H&H.  Will transfuse as needed.  At some point, will order a  follow up CT.  I have discussed with the family  the risks of breaking  down her I&R so that she can stop the bleed in her hip.  I explained  that to the patient and the family.   Thank you in advance.      Deirdre Peer. Polite, M.D.  Electronically Signed     RDP/MEDQ  D:  02/28/2007  T:  02/28/2007  Job:  [id number]

## 2010-07-16 NOTE — Discharge Summary (Signed)
NAME:  Sherry Howell, Sherry Howell         ACCOUNT NO.:  [id number]   MEDICAL RECORD NO.:  [id number]          PATIENT TYPE:  INP   LOCATION:  4736                         FACILITY:  [facility]   PHYSICIAN:  Armanda Magic, M.D.     DATE OF BIRTH:  10/06/1925   DATE OF ADMISSION:  11/12/2006  DATE OF DISCHARGE:  11/18/2006                               DISCHARGE SUMMARY   DISCHARGE DIAGNOSES:  1. Acute right heart systolic failure symptoms, resolved.  2. Renal insufficiency.  3. Borderline hypokalemia.  4. Systemic anticoagulation.  5 . History of mitral stenosis status post mechanical mitral valve.  1. Long-term Coumadin therapy.  2. History of hypertension.  3. Tachybrady syndrome with a history of permanent pacemaker February      2006 by Dr. Amil Amen, [employer] type.  4. Normal coronary arteries on diagnostic cardiac catheterization      2006.   HOSPITAL COURSE:  Ms. Wonders was admitted on November 12, 2006,  complaining of weight gain, 10 pounds over one week.  She also had some  mild shortness of breath and chronically sleeps upright in a recliner,  but this has not changed.  She noted swelling in her hands, feet and  also had noted abdominal fullness.  Chest x-ray showed stable  cardiomegaly with a stable right small pleural effusion.  Her BNP was  slightly elevated at 529.  However, she was diuresed.   She did have some mild renal failure during her hospitalization, and we  did consult the renal service.  Renal ultrasound showed bilateral  atrophy but no other suggestions were noted.  By discharge, the  patient's BUN was 32 with a creatinine of 1.48.   The patient did have a D-dimer during hospitalization, and this was  elevated.  A VQ scan was performed that showed moderate probability, and  in that setting, her INR was at 1.9 and was subtherapeutic from when she  came into the hospital.  Eventually, a CT scan of the chest showed no  evidence of PE.  During this time, we did have  the Hematology Service  following this patient in the event that she had some type of  coagulopathy which was not felt to be the case once her CT scan came  back negative.   By November 18, 2006, she was felt to be ready for discharge to home.   DISPOSITION:  She is discharged to home.   DISCHARGE MEDICATIONS:  1. Toprol XL 50 mg one tablet daily.  2. Coumadin.  She usually takes 5 mg a day alternating with 2.5 mg a      day at home.  Today and tomorrow I have instructed her to take 1-      1/2 tablets (totaling 7.5 mg) daily and then coming to the office      on Friday, September 19, for a protime/INR check.  Until that time,      she will need be on Lovenox, one injection every 12 hours.  We have      samples that we will give her approximating 90 mg subcu q.12 h.  3. She will also go  home on Lasix 40 mg a day.  4. Potassium 20 mEq one half tablet daily.  5. Pacerone 200 mg 1/2 tablet daily.   FOLLOWUP:  She is to follow up with Dr. Marjory Sneddon of the renal  team.  She is to call [phone] for an appointment.  She is to follow up  with Dr. Mayford Knife on October 2 at 1:15 p.m., and in the meantime, she is  to have the protime INR checked as we have previously.   DISCHARGE INSTRUCTIONS:  She is to remain on a low-sodium heart-healthy  diet.  Activity as tolerated.   CONDITION ON DISCHARGE:  She is discharged to home in stable and  improved condition.      Guy Franco, P.A.      Armanda Magic, M.D.  Electronically Signed    LB/MEDQ  D:  11/18/2006  T:  11/18/2006  Job:  [id number]   cc:   Cecille Aver, M.D.  Armanda Magic, M.D.  Elsworth Soho, M.D.

## 2010-07-16 NOTE — Discharge Summary (Signed)
NAME:  Sherry Howell, Sherry Howell         ACCOUNT NO.:  [id number]   MEDICAL RECORD NO.:  [id number]          PATIENT TYPE:  INP   LOCATION:  5506                         FACILITY:  [facility]   PHYSICIAN:  Corinna L. Lendell Caprice, MDDATE OF BIRTH:  1925/04/08   DATE OF ADMISSION:  02/28/2007  DATE OF DISCHARGE:                               DISCHARGE SUMMARY   DISCHARGE DIAGNOSES:  1. Status post fall and left buttock hematoma.  2. Supratherapeutic Coumadin.  3. History of mitral valve replacement.  4. History of pacemaker.  5. Acute blood loss anemia.  6. Tachybrady syndrome.  7. Renal insufficiency.   DISCHARGE MEDICATIONS:  1. Percocet 5/325 one to two p.o. q.4 h p.r.n. pain, 30 were      dispensed.  2. Change Coumadin to 2.5 mg a day and adjust as needed.  She will      follow up with the Coumadin clinic next week.  Continue other outpatient medications which include:  1. Toprol XL 50 mg a day.  2. Lasix 40 mg a day.  3. Klor-Con 20 mEq a day.  4. Pacerone 250 mg a day.   CONSULTATIONS:  None.   PROCEDURES:  None.   ACTIVITY:  She is to use her walker.   FOLLOW UP:  With Coumadin Clinic next week and Dr. Clovis Riley as needed.   DIET:  Should be cardiac Coumadin friendly.   CONDITION:  Stable.   LABORATORY DATA:  Initial INR was 4.3, at discharge is 2.1.  The initial  CBC significant for hemoglobin of 8.5, hematocrit 25.5 and dropped to a  low of 7.7 twice and required transfusion.  At the time of discharge her  hemoglobin was stable at 8.7.  Initial complete metabolic panel  significant for a glucose of 145, BUN of 41, creatinine 1.71, otherwise  unremarkable.  At discharge her glucose was 108, BUN 32, creatinine  1.33.   SPECIAL STUDIES AND RADIOLOGY:  CT of the pelvis on admission showed no  fracture, large hematoma in the subcutaneous tissues of the left  buttock, severe multifactorial spinal stenosis at L3-L4.  A repeat CT of  the pelvis was reportedly done on the 30th,  but PACS imaging system was  down and so I do not have this result.   HISTORY AND HOSPITAL COURSE:  Sherry Howell is a 77 year old white female  with the above-mentioned problems on Coumadin who fell when one of her  grandkids jumped into her arms.  This happened 3 days prior to  admission.  She had progressive difficulty walking and bruising of her  buttocks.  She was found to have a large hematoma and coagulopathy  secondary to Coumadin as well as anemia.  She was admitted for physical  therapy, transfusion of fresh frozen plasma and she required a packed  red blood cell transfusion.  Her pain improved.  At the time of  discharge she was able to walk with a walker.  Physical therapy and  occupational therapy recommended home health physical therapy,  occupational therapy, but she declined this.  She is feeling much better  and her hemoglobin has been stable.  The area  of hematoma is much softer  despite the remaining ecchymoses.  Her Coumadin was adjusted and will  need a recheck next week.  Her other medical problems remained stable.      Corinna L. Lendell Caprice, MD  Electronically Signed     CLS/MEDQ  D:  03/03/2007  T:  03/03/2007  Job:  [id number]   cc:   L. Lupe Carney, M.D.  Armanda Magic, M.D.

## 2010-07-16 NOTE — Consult Note (Signed)
NAME:  Sherry Howell, Sherry Howell         ACCOUNT NO.:  [id number]   MEDICAL RECORD NO.:  [id number]          PATIENT TYPE:  INP   LOCATION:  4736                         FACILITY:  [facility]   PHYSICIAN:  Valentino Hue. Magrinat, M.D.DATE OF BIRTH:  1925-07-25   DATE OF CONSULTATION:  DATE OF DISCHARGE:                                 CONSULTATION   REFERRING PHYSICIAN:  Dr. Armanda Magic   REASON FOR CONSULTATION:  Abnormal INR.   HISTORY OF PRESENT ILLNESS:  Sherry Howell is a pleasant, 77 year old  [place] woman, asked to see for evaluation of abnormal INR in the  setting of pulmonary embolism.  In review, she is status post mitral  valve replacement February of 2006, and has been coumadinizing since,  followed through [facility] with close monitoring.  She has  now been admitted with worsening CHF symptoms, for which an echo showed  a 50 to 55% ejection fraction, with moderate mitral regurgitation, and a  VQ scan on September 14 showing moderate probability for PE, with  segmented defects in the left lung.  Admission PT/INR were 3.6, her D-  dimer was 0.68 on September 13, and her creatinine was elevated on  admission at 2.4, which now is beginning to normalize, today being 1.56  with optimization of oral fluid management.  Today, her INR is 1.9, for  which Lovenox was initiated per pharmacy at 90 mg q.12 hours.  She  continues to receive Coumadin.  It is important to mention, that the  patient may have skipped a dose upon admission of Coumadin and her  recommended dose at the time of admission since October 22, 2006 was 7.5  mg a day.  She also admits to eating significant amount of greens.   PAST MEDICAL HISTORY:  1. Hypertension.  2. Prior TIA - lacunar infarct per old CT.  3. Severe mitral stenosis status post mitral valve replacement.  4. Class IV CHF.  5. History of atrial fibrillation on Coumadin.  6. History of sick sinus syndrome, status post pacemaker placement.  7.  Acute renal insufficiency within this admission, now optimizing.  8. DJD of the spine.   SURGERIES:  Status post MVR with MAZE procedure 2006 status post  pacemaker placement secondary to sick sinus syndrome.   ALLERGIES:  MORPHINE, SULFA.   CURRENT MEDICATIONS:  1. Amiodarone 100 mg q.d.  2. Lovenox 90 mg q.d. starting today.  3. Coumadin 5 mg q.d.  4. Lasix 40 mg q.d.  5. Toprol 50 mg q.d.  6. Xanax p.r.n.  7. Morphine sulfate p.r.n.  8. Milk of magnesia.  9. Zofran p.r.n.  10.Ambien p.r.n.   REVIEW OF SYSTEMS:  The patient denies to any significant NSAID use.  She has been fatigued.  She has experienced a ten-pound weight gain over  the last week.  Dyspnea on exertion.  Nonproductive cough.  Palpitations.  Ankle swelling.  Joint pain.  Eats significant amount of  greens.  The rest of the review of systems is negative.   FAMILY HISTORY:  Mother died with leukemia and liver cancer; father died  with osteoarthritis and MI; one brother alive with  a recent stroke.   SOCIAL HISTORY:  The patient is widowed, three children, her daughter,  Dois Howell, is present today during the examination.  No alcohol or tobacco  history.  The patient single and widowed.  She is [employer]. She is  retired.  She remains fairly independent.   PHYSICAL EXAMINATION:  GENERAL:  This is a moderately obese, 77 year old  white female in no acute distress, alert and oriented x3.  VITAL SIGNS:  Blood pressure 111/57, pulse 80, respirations 20,  temperature 98.3, pulse oximetry 100% in room air.  HEENT:  Normocephalic, atraumatic.  PERRLA.  Mucosa without thrush or  lesions.  NECK:  Supple.  No cervical or supraclavicular masses.  LUNGS:  Essentially clear to auscultation bilaterally.  No axillary  masses.  CARDIOVASCULAR:  Regular rate and rhythm, with no murmurs or rubs.  There is a split S1, S2.  ABDOMEN:  Obese, nontender.  Bowel sounds x4 .  No palpable  splenomegaly.  GU AND RECTAL:  Deferred.   EXTREMITIES:  With no clubbing or cyanosis.  Bilateral 2+ edema in the  lower extremities.  SKIN:  Without lesions or bruising.  No petechial rash.  NEURO:  Nonfocal.   LABS:  Hemoglobin 11.9, hematocrit 35.6, white count 7.4, platelets 238,  MCV 90.2.  PTT 22.7, INR 1.9.  TSH 3.563.  Sodium 138, potassium 3.3,  BUN 38, creatinine 1.56, glucose 108.  D-dimer 0.68, C4 21, C3 98.  SPEP  and UPEP are pending.   ASSESSMENT/PLAN:  This is a 77 year old [place] woman, status post  mitral valve replacement in February of 2006, and on coumadin since  then, followed through [facility] with close monitoring,  now admitted with worsening congestive heart failure symptoms, with an  echo showing a 50 to 55% ejection fraction, with moderate mitral  regurgitation.  VQ scan on September 14 was seen as moderate probability  with segmental defects in the left lung periphery, not matched by the  abnormalities.  Admission PT/INR were 40.5 and 3.6, respectively, D-  dimer 0.68 and creatinine of 2.4 which later dropped to 1.56 with  optimization of fluid management.   If the patient has a pulmonary embolism with an INR of greater than 3.5,  there are basically two possibilities, 1) The INR does not reflect the  level of anticoagulation.  This can be seen with positive LAC and other  inferring antibodies.  Will ask the labs to read Factor II level from  the blue top tube drawn today. 2)  Alternatively the therapeutic  Coumadin level with new clot could suggest a primary hypercoagulable. We  can send labs for the most common reasons (Factor V Leiden, etc.).  Cancer is another possibility, of course obesity also contributes.  Will  request the appropriate labs.   If the patient did have a pulmonary embolism, and the INR was valid, she  will need to be on Lovenox indefinitely.  This can be a financial  problem, but is doable.  Dr. Darnelle Catalan, who has seen and evaluated the  patient, see no need  for an IVC filter at this point.  The final  question is whether the patient's creatinine has improved sufficiently  that we  could obtain a more definite contrast study before changing the patient  to lifelong Lovenox shots.  We will leave that to renal discretion.  We  will follow with you while the patient is in the hospital.   Thank you very much for allowing [place] the opportunity  to participate in  Ms. Mroz's care.      Marlowe Kays, P.A.      Valentino Hue. Magrinat, M.D.  Electronically Signed    SW/MEDQ  D:  11/17/2006  T:  11/17/2006  Job:  [id number]   cc:   L. Lupe Carney, M.D.

## 2010-07-19 NOTE — Consult Note (Signed)
NAME:  Sherry Howell, Sherry Howell         ACCOUNT NO.:  [id number]   MEDICAL RECORD NO.:  [id number]          PATIENT TYPE:  INP   LOCATION:  2305                         FACILITY:  [facility]   PHYSICIAN:  Genene Churn. Love, M.D.    DATE OF BIRTH:  [birth year]   DATE OF CONSULTATION:  04/09/2004  DATE OF DISCHARGE:                                   CONSULTATION   This 77 year old right handed white, widowed female was admitted April 03, 2004, for symptoms of congestive heart failure, pulmonary hypertension, and  recent onset of atrial fibrillation secondary to long-standing mitral valve  stenosis.  She was found to have a valve area of 0.8 with transvalvular  gradient of over 15 mmHg and underwent elective mitral valve replacement and  Maze procedure on April 03, 2004.  Her postoperative course was  complicated by atrial fibrillation and considerations of a permeant  pacemaker but does have an external pacemaker that is currently not on.  She  has been on heparin therapy for atrial fibrillation.  This morning, while  walking, she noted left arm and jaw pain without changes in her EKG and with  elevated troponins thought still to represent post cardiac surgery muscle  changes.  Following this, she was on the toilet and developed transient  right arm weakness for approximately one hour in which her arm was flaccid  but then recovered.  She did not have any headache with this or chest pain  and did not notice any palpitations.   PAST MEDICAL HISTORY:  Significant for mitral stenosis with recent onset  atrial fibrillation.  She has had a previous left brain stroke in 2001  lasting less than 24 hours causing right sided weakness.  There is a long-  standing history of hypertension, obesity, kidney stones, degenerative  arthritis in the lower back and hips, and ulcerated lesion of her left  pretibial area thought to possibly to represent basal cell carcinoma.   PHYSICAL EXAMINATION:  GENERAL:  Well  developed white female sitting in the char.  VITAL SIGNS:  Blood pressure right and left arm 140/80 and 130/80, heart  rate 79 and irregularly irregular, she was on heparin, she was afebrile,  there were no bruits.  NEUROLOGICAL:  Mental status exam reveals she is alert ands oriented x 3.  Cranial nerve examination reveals visual fields to show evidence of a right  inferior anopsia, pupils react from 3 to 2 bilaterally.  Extraocular  movements are full.  Corneals present.  Hearing decreased, air conduction  better than bone conduction.  The tongue was midline, uvula midline, gag was  present.  Sternocleidomastoid and trapezius testing were normal.  Motor  examination revealed good strength in the upper and lower extremities except  for joint deformities.  There was no evidence of any proximal or distal  drift.  Sensory examination was intact to two point graphesthesia and pin  prick.  Deep tendon reflexes were 2+.  Plantar responses were downgoing.   IMPRESSION:  1.  Left brain TIA, 425.9, versus embolic stroke, 045.40.  2.  Visual field cut, 368.4.  3.  Atrial fibrillation, 427.31.  4.  Status post mitral valve replacement, 421.  5.  Jaw pain and left arm pain, rule out MI, 429.2.  6.  History of left brain stroke in the year 2001, 434.01.   PLAN:  The plan at this time is to obtain a CT scan, re-evaluate her visual  fields, and continue heparin if possible.      JML/MEDQ  D:  04/09/2004  T:  04/09/2004  Job:  [id number]

## 2010-07-19 NOTE — Op Note (Signed)
NAME:  Sherry Howell, Sherry Howell         ACCOUNT NO.:  [id number]   MEDICAL RECORD NO.:  [id number]          PATIENT TYPE:  INP   LOCATION:  2028                         FACILITY:  [facility]   PHYSICIAN:  Francisca December, M.D.  DATE OF BIRTH:  June 10, 1925   DATE OF PROCEDURE:  04/11/2004  DATE OF DISCHARGE:                                 OPERATIVE REPORT   PROCEDURE PERFORMED:  1.  Insertion of permanent transvenous dual chamber pacemaker.  2.  Left subclavian venogram.   INDICATIONS FOR PROCEDURE:  Sherry Howell is a 77 year old woman who  is now nine days status post mitral valve replacement and maze procedure.  She has developed tachy-brady syndrome.  She has intermittent atrial  fibrillation.  Attempts to treat with beta blocker and amiodarone have  resulted in prolonged sinus pauses.  She was brought to the catheterization  laboratory at this time for insertion of a dual chamber permanent  transvenous pacemaker.   DESCRIPTION OF PROCEDURE:  The patient was brought to the cardiac  catheterization laboratory in a fasted state.  The left prepectoral region  was prepped and draped in the usual sterile fashion.  Local anesthesia was  obtained with infiltration of 1% lidocaine with epinephrine throughout.  A  left subclavian venogram was then performed using the peripheral injection  of 20 mL of Omnipaque.  A digital cine angiogram in the AP projection was  obtained and roadmapped to guide future left subclavian puncture.  The  left subclavian vein was noted to be widely patent and coursing in a normal  fashion over the anterior surface of the first rib underneath the middle  third of clavicle.  There was no evidence for persistence of the left  superior vena cava.   A 6 to 7 cm incision was then made in the deltopectoral groove and this was  carried down by sharp dissection with electrocautery to the prepectoral  fascia.  There a plane was lifted and a pocket formed inferiorly and  medially utilizing blunt dissection.  The pocket was then packed with a 1%  Kanamycin soaked gauze.  The left subclavian vein was then punctured twice  utilizing an 18 gauge thin-walled needle through which was passed a 0.038  inch tight J-guide wire.  Over the initial guidewire, a 7 French tear-away  sheath and dilator were advanced.  The dilator and wire were removed and the  ventricular lead was advanced to the level of the right atrium.  The sheath  was torn away.  Using standard technique and fluoroscopic landmarks, the  lead was manipulated into the right ventricular apex.  There, excellent  pacing parameters were obtained after the screw was advanced.  This was an  active fixation lead.  The pacing parameters are noted below.  The lead was  tested for diaphragmatic pacing at 10 V and none was found.  The lead was  then sutured into place using three separate 0 silk ligatures.  Over the  remaining guidewire, a 9 French tear-away sheath and dilator were advanced.  The dilator was removed, the wire was allowed to remain in place and the  atrial lead  was advanced to the level of the right atrium.  The sheath was  then torn away.  A figure-of-eight hemostasis suture was then placed around  the lead entry site into the pectoralis muscle due to back bleeding.  Using  standard technique and fluoroscopic landmarks, the lead was manipulated into  the remnant of the right atrial appendage.  There, adequate pacing  parameters were obtained both prior to and after advancing the active  fixation screw.  These parameters were reported below. The lead was tested  for diaphragmatic pacing at 10 V and none was found.  The lead was then  sutured into place using three separate 0 silk ligatures.  At this point the  remaining guidewire and the Kanamycin soaked gauze were removed from the  pocket and the pocket was copiously irrigated using 1% Kanamycin solution.  The pocket was inspected for bleeding and  none was found.  The leads were  wound beneath the pacing generator and the pacing generator was placed in  the pocket.  The pacing generator was sutured to the pectoralis muscle with  a 0 silk ligature.  The wound was then closed using 2-0 Vicryl in a running  fashion for the subcutaneous layer.  The skin was approximated using 4-0  Vicryl in a running subcuticular fashion.  Steri-Strips and sterile dressing  were applied and the patient was transported to the recovery area in an A  sense V pace mode.   EQUIPMENT DATA:  The pacing generator is a Medtronic model number [id number]  EnRhythm, serial number [id number].  The atrial lead is a Medtronic model  number [id number], serial number [id number].  The ventricular lead is a Medtronic  model number [id number], serial [id number].   PACING DATA:  The atrial lead detected a 2.2 mV P-wave.  The pacing  threshold was 0.5 V at 0.5 msec pulse width.  The impedance was 551 ohms  resulting in a current at capture threshold of 2.2 mA.  The ventricular lead  detected a 15.7 mV R-wave.  The pacing threshold was 0.5 V at 0.5 msec pulse  width.  The impedance was 963 ohms resulting in a current at capture  threshold of 0.6 mA.      JHE/MEDQ  D:  04/11/2004  T:  04/12/2004  Job:  [id number]   cc:   Mikey Bussing, M.D.  [address]  [place]  [place] [address]

## 2010-07-19 NOTE — Cardiovascular Report (Signed)
NAME:  Sherry Howell, Sherry Howell                   ACCOUNT NO.:  [id number]   MEDICAL RECORD NO.:  [id number]                   PATIENT TYPE:  OIB   LOCATION:  2869                                 FACILITY:  [facility]   PHYSICIAN:  Francisca December, M.D.               DATE OF BIRTH:  02-05-1926   DATE OF PROCEDURE:  11/30/2001  DATE OF DISCHARGE:  11/30/2001                              CARDIAC CATHETERIZATION   PROCEDURES PERFORMED:  Right and left heart catheterization.   INDICATION:  The patient is a 77 year old woman with severe exertional  dyspnea and occasional rest dyspnea with PND.  She has undergone cardiac  catheterization and coronary angiography revealing no significant CAD and  normal LV systolic function.  However, she has significant mitral annular  calcification and evidence of mild-to-moderate mitral stenosis on 2-D  echocardiogram.  She is returned now to cardiac catheterization laboratory  to reassess the degree of mitral stenosis and right heart pressures by  cardiac catheterization technique.   PROCEDURAL NOTE:  The patient was brought to the cardiac catheterization  laboratory in a post-absorptive state.  The right groin was prepped and  draped in the usual sterile fashion.  Local anesthesia was obtained with the  infiltration of 1% lidocaine.  A 5-French catheter sheath was inserted  percutaneously into the right femoral artery utilizing an anterior approach  over a guiding J wire.  In a similar fashion, an 8-French catheter sheath  was inserted percutaneously into the right femoral vein.  A 7.5-French  balloon flow-directed Swan-Ganz catheter was then advanced through the right  heart chambers to the pulmonary artery wedge position.  A 110-cm pigtail  catheter was then advanced to the ascending aorta, where the central aortic  pressure was recorded.  The catheter was then prolapsed across the aortic  valve and positioned in the left ventricle.  Simultaneous  pulmonary  capillary wedge pressure and left ventricular pressures were then recorded.  Pressures were also recorded with the right heart catheter in the main  pulmonary artery, the right ventricle, and the right atrium.  Blood samples  for oxygen saturation determination by the oximetry method were obtained  from the superior vena cava, the right atrium, the inferior vena cava and  simultaneously from the pulmonary artery and ascending aorta.  At the  completion of procedure, the catheter and catheter sheaths were removed.  Hemostasis was achieved by direct pressure.  The patient was transported to  the recovery area in stable condition with a intact distal pulse.   HEMODYNAMIC DATA:  Utilizing the Fick principal and an estimated oxygen  consumption of 177 ml/min, the calculated cardiac output was 4.5 l/min and  index 2.2 l/min per sq meter.  Utilizing the thermodilution technique, the  cardiac output was 4.7 l/min and index 2.3 l/min per sq meter.  The right  heart pressures were as follows:  Right atrial pressure -- A wave 6 mmHg, V  wave 4  mmHg, mean 2 mmHg.  Right ventricular pressure was 38/6 mmHg.  Pulmonary artery pressure was 35/11 mmHg, mean of 21 mmHg.  Pulmonary  capillary wedge pressure -- A wave 24 mmHg, V wave 26 mmHg, mean 16 mmHg.  Left ventricular pressure 143/18 mmHg.  Calculated mitral valve mean  gradient was 10 mmHg and the mitral valve area utilizing the Gorlin formula  was 1.3 sq cm.    FINAL IMPRESSION:  1. Moderate aortic stenosis.  2. Normal right heart pressures.                                               Francisca December, M.D.    JHE/MEDQ  D:  11/30/2001  T:  12/04/2001  Job:  [id number]   cc:   Armanda Magic, M.D.  [address]  [place], [place] [address]  Fax: [phone]   L. Lupe Carney, M.D.

## 2010-07-19 NOTE — Op Note (Signed)
NAME:  Sherry Howell, Sherry Howell         ACCOUNT NO.:  [id number]   MEDICAL RECORD NO.:  [id number]          PATIENT TYPE:  INP   LOCATION:  2305                         FACILITY:  [facility]   PHYSICIAN:  Kathlee Nations Trigt III, M.D.DATE OF BIRTH:  04/09/1925   DATE OF PROCEDURE:  04/03/2004  DATE OF DISCHARGE:                                 OPERATIVE REPORT   PROCEDURE:  1.  Mitral valve replacement with a 24 mm mechanical ATS valve.  2.  Maze  procedure.   PREOPERATIVE DIAGNOSES:  Severe mitral stenosis with atrial fibrillation and  class IV congestive heart failure, pulmonary hypertension.   POSTOPERATIVE DIAGNOSES:  Severe mitral stenosis with atrial fibrillation  and class IV congestive heart failure, pulmonary hypertension.   SURGEON:  Kerin Perna, M.D.   ASSISTANT:  Sheliah Plane, M.D., Rowe Clack, P.A.-C.   ANESTHESIA:  General.   INDICATIONS FOR PROCEDURE:  The patient is a 77 year old obese white female  with longstanding mitral stenosis.  She has progressed to Class 4 symptoms  and was found to have a valve area of 0.8 with a transvalvular gradient over  15 mmHg.  She has recently developed atrial fibrillation and her coronaries  were normal by cath performed by Dr. Armanda Magic.  Her pulmonary artery  pressures were elevated at 65/35 and she was felt to be a candidate for  mitral valve replacement with a Maze  procedure.   Prior to surgery, I examined the patient in the office and reviewed the  patient's cardiac catheterization and echo studies with the patient and her  daughter. I discussed the indications and expected benefits of mitral valve  replacement for treatment of her severe mitral stenosis.   I also discussed the procedure that would be done simultaneously using  radiofrequency ablation (Maze  procedure) in the left atrium to prevent  further atrial fibrillation.   I discussed with the patient the major aspects of the procedure including  the location  of the surgical incisions, the use of general anesthesia, the  plan to use a mechanical valve to replace the mitral valve, cardiopulmonary  bypass and the expected postoperative hospital recovery. I had discussed  with the patient the specific risks to her of  mitral valve replacement, including the risks of MI, stroke, bleeding,  infection and death.  She understood these implications for the surgery and  agreed to proceed with the operation as planned, under what I felt was an  informed consent.   OPERATIVE FINDINGS:  The patient's body habitus and biventricular  enlargement made exposure of the heart very difficult. There was severe,  calcified mitral stenosis with calcium extending into the myocardium from  the posterior annulus. This was extensive myocardial calcification.  The  patient received 1 unit of platelets following Protamine administration for  persistent coagulopathy.   DESCRIPTION OF PROCEDURE:  The patient was brought to the operating room and  placed supine on the operating table where general anesthesia was induced  under invasive hemodynamic monitoring. The chest, abdomen and legs were  prepped with Betadine and draped as a sterile field. The anesthesiologist  placed a transesophageal  2-D echo probe which confirmed the preoperative  diagnosis of mitral stenosis with fairly well preserved LV function.  A  sternal incision was made and the pericardium was opened.  The sternal  retractor was placed.  Heparin was administered and the ACT was documented  as being therapeutic.  Pursestring was placed in the right atrium and  ascending aorta and the patient was cannulated and placed on bypass.  A  second pursestring was placed in the inferior aspect of the right atrium for  bicaval cannulation and drainage.   Intra-atrial groove was dissected and tapes were placed around the SVC and  IVC.  Cardioplegic catheters were placed for both antegrade, aortic and  retrograde  coronary sinus cardioplegia.  The radiofrequency (Medtronic  bipolar) applicator was then placed around the atrial appendage and fired to  produce transmural radiofrequency ablation line.  An attempt was made to use the bipolar device to encircle the left pulmonary  veins, however, the amount of calcium on her posterior wall was so extensive  that the heart was not mobile, and I was concerned that pulling up on the  heart could fracture this calcium and lead to catastrophic hemorrhage.  For  that reason, the left-sided Maze was performed after the heart was cross-  clamped, cardiopleged and the atriotomy performed.   The patient was then cooled to 30 degrees.  Aortic cross-clamp was applied.  One L of cold blood cardioplegia was delivered between the antegrade aortic  and retrograde coronary sinus cardioplegic catheters.  There was excellent  cardioplegic arrest and septal temperature dropped to less than 10 degrees.  Topical iced saline was used to augment myocardial preservation.   The left atriotomy was then performed.  After the atriotomy was made, the  bipolar radiofrequency applicator was then applied to the posterior aspect  of the left atrium to complete an encircling ablation from the right-sided  pulmonary veins.  Next, the Maze procedure was completed in the left atrial  chamber by using the monopolar radiofrequency applicator around the left-  sided pulmonary veins. The ablation line between the left-sided veins and  the right-sided veins was then connected with an application of a unipolar  radiofrequency ablation.  The left-sided pulmonary veins were then connected  to the annulus of the mitral valve and the area of P3 segment.  Finally, a  monopolar ablation line was connected between the left-sided pulmonary vein  ablation and the left atrial appendage. This completed the Maze procedure.   It should be noted that prior to performing the unipolar ablation lines,  the transesophageal echo probe was withdrawn above the heart.  Cardioplegia was  reduced.  The atrial retractor was then positioned to expose the mitral  valve. The mitral valve was heavily calcified and there was a very small  orifice.  The valve was considered non-repairable. The anterior leaflet was  then excised while running into some severe calcification on the lateral  commissure.  Super-annular pledgets of 2-0 Ethibond sutures were then placed  around the superior aspect of the anterior leaflet annulus.  Next, the  posterior leaflet was inspected. There was very leaflet tissue remaining, it  was just 1 large mass of calcium which extended down into the myocardium.  The subvalvular apparatus was released, separating the annulus from the  papillary muscles.  There was no leaflet that could be excised using the  scissors.  Using the rongeur, the posterior annulus was debrided of calcium  carefully, so as not to penetrate  the posterior aspect of the left ventricle  myocardium.  Care was taken to remove all the particulate pieces of calcium  and several separate irrigations of the operative field were performed, with  careful suctioning of any calcified particulate matter.   When the calcium was felt to be adequate to allow a prosthetic valve. The  super-annular annular suture was then completed around the posterior  annulus.  Sixteen sutures in total were used.  The annulus was then sized to  a 24 mm ATF mitral valve.  Cardioplegia was given every 20 minutes during  the period of cross-clamp.  The valve sutures were then placed through the  sewing ring with the ATF valve, and the valve was seated and the sutures  were tied.  There was an area on the medial commissure that required an  extra 4-0 Prolene pledgeted suture to bring the atrial tissue up to the  sewing ring, as calcium in that area prevented secure seating. A second area  was also noted at the lateral commissure and a second  4-0 Prolene suture was  used to bring the atrial wall up to the sewing ring of the valve.   The valve leaflets were inspected and found to open and close without  impingement or obstruction from underlying subannular calcium. A 10 French  Foley catheter was then placed through the prosthetic valve into the left  ventricle and connected to the LV vent suction line.  The atrium was then  again inspected and irrigated carefully. The atrial appendage was then  oversewn on the endocardial surface using running 4-0 Prolene in a double  suture line.  Next, the patient was rewarmed, and the atriotomy was closed  with a 4-0 running Prolene double suture line.  Prior to tying the first  suture line, air was vented from the heart with a dose of retrograde warm  blood cardioplegia, as well as the usual de-airing maneuvers on bypass, and  the Foley catheter was removed as the last suture was tied.   The heart was then reperfused as the cross-clamp was removed and the heart resumed a spontaneous rhythm. The second atrial suture line was then  completed.   The patient was then rewarmed and re-perfused.  The atriotomy and  cannulation sites were inspected and found to be hemostatic.  The  cardioplegic catheters were removed, as was the aortic vent.  The inferior  caval cannula was removed and the superior caval cannula was repositioned in  the mid-atrial chamber.  The lungs were reexpanded and when the patient ws  rewarmed and reperfused adequately, temporary pacing wires were applied and  AV sequential pacing was initiated. The patient was separated from bypass on  low dose Dopamine and milrinone, with stable blood pressure and good cardiac  output.  Protamine was administered without adverse reaction. The cannulas  were removed.  The mediastinum was irrigated with warm, antibiotic  irrigation.   The post pump TEE showed the mitral valve to be functioning adequately,  although there was 1 jet of  regurgitation on the posterior annulus in the  area of the heavy calcification. This was judged to be mild mitral  regurgitation.  Overall LV function looked good and there was no evidence of  significant retained air in the heart.  The patient remained stable and the  Protamine was successfully completed.  The patient did have some persistent  coagulopathy and a unit of plantlet was given.   The superior pericardial fat was closed.  Two mediastinal chest tubes were  placed and brought out through separate incisions. The sternum was then  closed with interrupted steel wire. The pectoralis fascia was closed with a  running #1 Vicryl, the subcutaneous and skin layers were closed with running  Vicryl and sterile dressings were applied. Total cross-clamp time was 2  hours and 10 minutes.      PV/MEDQ  D:  04/03/2004  T:  04/03/2004  Job:  [id number]   cc:   Armanda Magic, M.D.  [address], [address]  [place], [place] [address]  Fax: [phone]   [facility] office

## 2010-07-19 NOTE — Cardiovascular Report (Signed)
NAME:  Sherry Howell, Sherry Howell         ACCOUNT NO.:  [id number]   MEDICAL RECORD NO.:  [id number]          PATIENT TYPE:  OIB   LOCATION:  2899                         FACILITY:  [facility]   PHYSICIAN:  Armanda Magic, M.D.     DATE OF BIRTH:  [birth year]   DATE OF PROCEDURE:  03/20/2004  DATE OF DISCHARGE:                              CARDIAC CATHETERIZATION   REFERRING PHYSICIAN:  L. Lupe Carney, M.D.   PROCEDURES:  1.  Right and left heart catheterization.  2.  Coronary angiography.  3.  Left ventriculography.   OPERATOR:  Armanda Magic, M.D.   INDICATIONS:  Mitral stenosis.   COMPLICATIONS:  None.   IV ACCESS:  Via right femoral artery, 6-French sheath and right femoral  vein, 8-French sheath.   This is a very pleasant 77 year old white female who has had a known  diagnosis of mitral stenosis in the past and actually underwent cardiac  catheterization a little over two years ago and was found to have moderate  to severe mitral stenosis.  Because of family illness, she opted at that  time for medical therapy; she did not want to have surgery.  She now  presents recently with worsening shortness of breath and fatigue, decreased  exercise tolerance, and has also been found to be in new onset atrial  fibrillation and now presents for repeat heart catheterization.   The patient was brought to the cardiac catheterization laboratory in the  fasting, nonsedated state.  Informed consent was obtained.  The patient was  connected to the continuous heart rate and pulse oximetry monitoring and  intermittent blood pressure monitoring.  The right groin was prepped and  draped in a sterile fashion; 1% Xylocaine was used for local anesthesia.  Using the modified Seldinger technique, a 6-French sheath was placed in the  right femoral artery.  Under fluoroscopic guidance, a 8-French sheath was  placed in the right femoral vein.  Under fluoroscopic guidance, a Swan-Ganz  catheter was placed under  balloon floatation in the right atrium.  Right  atrial pressure was measured and right atrial O2 saturations were obtained.  The catheter was then advanced into the right ventricle, and right  ventricular pressure was observed.  The catheter was then attempted to be  advanced into the pulmonary artery, but it was somewhat difficult.  Therefore, a Swan-Ganz catheter wire had to be used to aid in advancing the  catheter into the pulmonary artery which was successfully done.  The  pulmonary artery pressure and O2 saturations were measured.  The catheter  was then allowed to flow into the pulmonary capillary wedge position.  The  catheter was then deflated and the catheter pulled back into the pulmonary  artery.  Cardiac outputs were measured on three separate occasions using 10  mL of saline at a time.  A 6-French pigtail catheter was then placed under  fluoroscopic guidance in the left ventricular cavity.  The Swan-Ganz  catheter balloon was inflated again.  The catheter was allowed to fall into  the wedge position.  Simultaneous pulmonary capillary wedge and LV pressures  were measured.  The balloon catheter was then  deflated, and the Swan-Ganz  catheter was removed.   Left ventriculography was performed in the 30 degree RAO view, a total of 30  mL contrast at 15 mL per second.  The catheter was then pulled back across  the aortic valve with no significant gradient noted.  The catheter was then  exchanged out over a guidewire for 6-French JR-4 catheter which was placed  under fluoroscopic guidance in the left coronary artery.  Multiple sine  films were taken at 30 degree RAO, 40 degree LAO views.  This catheter was  then exchanged over guidewire for 6-French JR-4 catheter which was placed  under fluoroscopic guidance into the right coronary artery.  Multiple sine  films were taken at 30 degree RAO, 40 degree LAO views.  At the end of the  procedure, all catheters and sheathes were removed.   Manual compression was  performed until adequate hemostasis was obtained.  The patient was  transferred back to her room in stable condition.   RESULTS:  1.  Right heart catheterization data.      1.  Right atrial pressure 17/15 with a mean of 13 mmHg.      2.  RV pressure 62/3 with a mean of 9 mmHg.      3.  PA pressure 58/26 with a mean of 40 mmHg.      4.  Pulmonary capillary wedge 25/28 with a mean of 20 mmHg.  2.  Cardiac output      1.  Fick was 3.1.  Cardiac index Fick was 1.6.      2.  Thermodilution was 3.7, cardiac index thermodilution was 1.9.  3.  O2 saturation in the right atrium was 67%, PA 62%, AO 95%.  4.  Mitral valve gradient mean was 12.2 mmHg, mitral valve area was 0.79 cm      squared consistent with severe mitral stenosis.   LEFT VENTRICULOGRAPHY:  1.  Normal LV systolic function, EF 60%.  2.  Aortic pressure 115/60 mmHg,  3.  LV pressure 116/-2 mmHg.  4.  LV EDP 14 mmHg.  5.  Mitral valve annulus was heavily calcified as well as the leaflets with      significantly reduced leaflet excursion.   The left coronary artery was widely patent and bifurcates into a left  anterior descending artery and left circumflex artery.   The left anterior descending artery is widely patent throughout the course  of the apex and gives rise to two diagonal branches, both of which are  widely patent.   The left circumflex is widely patent throughout its course and gives rise to  one very large obtuse marginal branch which bifurcates into two daughter  branches, both of which are widely patent.  The ongoing circumflex traverses  the A-V groove and is widely patent.   The right coronary artery is widely patent throughout its course distally  except for luminal irregularities and bifurcates into a posterior descending  artery and posterolateral artery, both of which are wide patent.   ASSESSMENT: 1.  Severe mitral stenosis.  2.  Moderate to severe pulmonary hypertension.  3.   Normal coronary arteries with luminal irregularities.  4.  Normal left ventricular function.  5.  Atrial fibrillation.   PLAN:  1.  IV fluids and bed rest for 6 hours.  2.  CVTS consult for mitral valve replacement by Dr. Donata Clay.  3.  Restart Coumadin.  4.  Follow up with me in two weeks.  5.  Follow up in Coumadin clinic on Monday, January 23.      TT/MEDQ  D:  03/20/2004  T:  03/20/2004  Job:  [id number]   cc:   L. Lupe Carney, M.D.  [address] [place]  [place] [address]  Fax: [phone]   Mikey Bussing, M.D.  [address]  [place]  [place] [address]

## 2010-07-19 NOTE — Cardiovascular Report (Signed)
[place]  Patient:    Sherry Howell, Sherry Howell Visit Number: [id number] MRN: [id number]          Service Type: CAT Location: [facility] 2869 [id number] Attending Physician:  Armanda Magic Dictated by:   Armanda Magic, M.D. Proc. Date: 06/15/01 Admit Date:  06/15/2001   CC:         Melvyn Neth D. Clovis Riley, M.D.  Cardiac Catheterization Laboratory   Cardiac Catheterization  PROCEDURE:  Heart catheterization.  CARDIOLOGIST:  Armanda Magic, M.D.  INDICATIONS:  Chest pain.  This is a 77 year old white female with a history of hypertension, a CVA in 2000, who has been having episodic chest pain, usually occurring at night time, lasting for about 20 minutes in duration. She usually has to sit straight up and take a deep breath to relieve the pain.  She says that when she lies flat she gets a pressure fluttering feeling at night.  She has to sleep in a chair.  She does have some exertional shortness of breath, but none that has gotten worse recently.  Her adenosine Cardiolite study showed apical ischemia with normal LV function, except for apical hypokinesis.  She now presents for a heart catheterization.  DESCRIPTION OF PROCEDURE:  The patient is brought to the cardiac catheterization laboratory in the fasting, nonsedated state.  An informed consent was obtained.  The patient was connected to continuous heart rate and pulse oximetry monitoring, and intermittent blood pressure monitoring.  The right groin was prepped and draped in a sterile fashion.  Xylocaine 1% was used for local anesthesia.  Using the modified Seldinger technique, a 6-French sheath was placed in the right femoral artery.  Under fluoroscopic guidance, a 6-French JL4 catheter was placed in the left coronary artery.  Multiple scine films were taken in the 30-degree RAO and 40-degree LAO views.  This catheter was then exchanged out over a guide wire for a 6-French JR4 catheter, which was placed under  fluoroscopic guidance in the right coronary artery.  Multiple scine films were taken in the 30-degree RAO and the 40-degree LAO views.  This catheter was then exchanged out over a guide wire for a 6-French angled pigtail catheter which was placed under fluoroscopic guidance in the left ventricular cavity.  The left ventriculography was performed in the 30-degree RAO view using a total of 30 cc of contrast at 13 cc per second.  The catheter was then pulled back across the aortic valve, with no significant gradient. At the end of the procedure all catheters and sheaths were removed.  Manual compression was performed until adequate hemostasis was obtained.  The patient was transferred back to her room in stable condition.  PRESSURES The LV pressure was 129/20 mmHg. The aortic pressure was 134/58 mmHg.  ASSESSMENT 1. Noncardiac chest pain. 2. Normal coronary arteries. 3. Normal left ventricular function. 4. Moderate to severe calcification of the mitral valve annulus.  PLAN 1. Discharge to home after bedrest and IV fluids. 2. A 2-D echocardiogram as an outpatient, to evaluate the mitral valve. 3. Follow up with Dr. Mayford Knife in two weeks. Dictated by:   Armanda Magic, M.D. Attending Physician:  Armanda Magic DD:  06/15/01 TD:  06/15/01 Job: [id number]

## 2010-07-19 NOTE — Discharge Summary (Signed)
NAME:  Sherry Howell, Sherry Howell         ACCOUNT NO.:  [id number]   MEDICAL RECORD NO.:  [id number]           PATIENT TYPE:   LOCATION:  2028                         FACILITY:  [facility]   PHYSICIAN:  Kerin Perna, M.D.  DATE OF BIRTH:  [birth year]   DATE OF ADMISSION:  04/03/2004  DATE OF DISCHARGE:                                 DISCHARGE SUMMARY   HISTORY OF PRESENT ILLNESS:  The patient is a 77 year old female with a  known history of mitral stenosis since her teenage years. For the past few  years, she has been followed by Dr. Mayford Knife with serial 2-D echocardiograms  and recently underwent a third cardiac catheterization. This demonstrated  severe mitral stenosis with a transvalvular gradient of 12 to 15 mmHg with a  calculated area of 0.8 and pulmonary artery pressures of 58/26. The patient  has dyspnea on exertion as well as orthopnea and recently developed atrial  fibrillation. Her decision to have surgery was not based on the recent death  of her husband from metastatic adenocarcinoma of the prostate in October of  2005. This was an issue that delayed surgery previously. The patient's  coronaries were normal at the time of cardiac catheterization and her left  ventricular function was 60%. She was admitted this hospitalization for the  procedure, mitral valve replacement.   PAST MEDICAL HISTORY:  1.  Hypertension.  2.  Obesity.  3.  Kidney stones.  4.  Severe mitral stenosis.  5.  Degenerative arthritis involving the lower back and hips.  6.  Ulcerated lesion of her left pretibial area possibly a basal cell      carcinoma.   ADMISSION MEDICATIONS:  1.  Toprol-XL 100 mg q.a.m. and 50 mg q.p.m.  2.  Cardizem CD 100 mg once daily.  3.  Lasix 40 mg once daily.  4.  Cough medicine and eye drops.   ALLERGIES:  CODEINE CAUSES ITCHING.   Family history, social history, review of symptoms, and physical exam,  please see the history and physical done at the time of admission.   HOSPITAL COURSE:  The patient was admitted electively and on April 03, 2004, taken to the operating room where she underwent the following  procedure:  1. Mitral valve replacement with a 24 mm mechanical ATS valve.  2. MAZE procedure. The patient tolerated the procedure well and was taken to  the surgical intensive care unit in stable condition.   The patient has done well. She has had a slow course. She has remained  hemodynamically stable; however, did have issues of symptomatic bradycardia  and atrial fibrillation which ultimately required placement of a permanent  transvenous dual pacemaker on April 11, 2004. Additionally, from a  clinical viewpoint, she has struggled with dyspnea on exertion and volume  overload but this has also shown a good and gradual improvement. She has  been placed on Coumadin for anticoagulation therapy for the valve. This has  been monitored closely with daily INRs. Also during the postoperative  period, she had a possible small cerebellar cardioembolic infarct. She  initially had some mild symptoms of right arm weakness. She was seen  in  consultation by Dr. Sandria Manly. From a clinical view point, she has had no  significant sequelae from the CVA although she has had some very slow  recovery due to the significant deconditioning that she has had both  preoperatively that has been exacerbated during the postoperative period.  From a pulmonary view point, she has shown a good and gradual improvement.  Oxygen has been weaned and she maintains good oxygen saturations on room  air. Surgically, all incisions are healing well without evidence of  infection. From a heart rhythm view point, her current rhythm is a paced  ventricular rhythm at 80 beats per minute. Most recent chest x-ray shows a  moderate right-sided pleural effusion that is stable in appearance.  Laboratory values are stable with most recent BUN and creatinine at 41 and  1.7 respectively. Most recent  INR dated April 19, 2004 is 3.5. Most  recent hemoglobin and hematocrit are also stable at 8.6 and 25.5 on April 14, 2004. Currently, she is felt to be a candidate for discharge on the  morning of April 20, 2004, pending morning round reevaluation, an  echocardiogram has been ordered and obtained on April 19, 2004 that will  be reviewed prior to discharge. The valve is noted to have a sharp click  without evidence of murmur clinically.   DISCHARGE MEDICATIONS:  1.  Toprol-XL 25 mg daily.  2.  For pain, Ultram 50 mg one or two every 4 to 6 hours as needed.  3.  Coumadin 2.5 mg daily and as directed by the cardiologist.  4.  Lasix 40 mg daily.  5.  K-Dur 20 mEq daily.  6.  Amiodarone 400 mg daily.  7.  Aldactone 25 mg daily.   DISCHARGE INSTRUCTIONS:  The patient received written instructions regarding  medications, activity, diet, wound care, and followup.   FOLLOWUP:  Followup will include PT/INR check at [facility] on  April 22, 2004. An appointment with Dr. Mayford Knife on April 29, 2004; Dr.  Donata Clay on May 10, 2004.   CONDITION ON DISCHARGE:  Stable and improved.   FINAL DIAGNOSIS:  Severe mitral valve stenosis.   OTHER DIAGNOSES:  1.  Postoperative anemia.  2.  History of atrial fibrillation chronic and also postoperatively.  3.  Hypertension.  4.  Obesity.  5.  History of kidney stones.  6.  History of degenerative joint disease.  7.  History of ulcerative lesion in the left pretibular area.      WEG/MEDQ  D:  04/19/2004  T:  04/21/2004  Job:  [id number]   cc:   Mikey Bussing, M.D.  [address]  [place]  [place] [address]   Armanda Magic, M.D.  [address]  [place], [place] [address]  Fax: [phone]   L. Lupe Carney, M.D.  [address] [place]  [place] [address]  Fax: [phone]   Donia Guiles, M.D.  [address] [place]  [place] [address] Fax: [phone]   Genene Churn. Love, M.D.  [address]  [address]  [place]  [place] [address]  Fax: [phone]   Francisca December, M.D.  [address]  [place]  [place] [address]  Fax: [phone]

## 2010-07-19 NOTE — Assessment & Plan Note (Signed)
[facility]                             PULMONARY OFFICE NOTE   NAME:[name], Sherry Howell                MRN:          [id number]  DATE:05/13/2006                            DOB:          Aug 31, 1925    PULMONARY OFFICE NOTE:  Computerized tomographic scan of the chest was  reviewed and does not show evidence of active pneumonitis; therefore, I  do not believe the reduction in diffusion capacity is due to the  Pacerone and she may remain on the 100  mg a day dosing.  She should  receive another diffusion capacity in one year's time  if it drops any  further.  A repeat CT scan and pulmonary referral should be considered,  but at this point she should stay on the [facility] as prescribed.     Charlcie Cradle Delford Field, MD, [place]  Electronically Signed    PEW/MedQ  DD: 05/13/2006  DT: 05/15/2006  Job #: [id number]   cc:   Armanda Magic, M.D.

## 2010-07-19 NOTE — Assessment & Plan Note (Signed)
[name]                             PULMONARY OFFICE NOTE   NAME:RATCLIFFSamaiya, Sherry Howell                MRN:          [id number]  DATE:05/07/2006                            DOB:          [birth year]      PULMONARY CONSULTATION NOTE   DATE OF VISIT:  May 07, 2006   CHIEF COMPLAINT:  Evaluate for amiodarone toxicity.   HISTORY OF PRESENT ILLNESS:  This is a 77 year old white female who has  been placed on amiodarone in February 2006; initially on 200 mg then her  dose was reduced to 100 mg a day in 2007.  She has had pulmonary  function's in January 2007 and again in January 2008 and in both  instances there has been a reduction in diffusion capacity.  She is  referred here to see if there could be evidence for amiodarone toxicity.  She currently has no cough, dyspnea or chest pain, no wheezing or mucous  production.  She is essentially asymptomatic from a respiratory  standpoint except for dyspnea with extreme exertion but she cannot move  too much because of arthritis and back issues.  She is a life-long never  smoker referred now for evaluation of potential amiodarone toxicity.  She has no acid reflux symptoms, no loss of appetite,  no change in  weight.  There is no abdominal pain, difficulty swallowing, sore throat,  tooth or dental problems, headaches, nasal congestion, sneezing,  itching, earache, anxiety or depression.   PAST MEDICAL HISTORY:  1. She had an MI in 2006 and a pacemaker placed shortly thereafter.  2. She had valve replacement surgery in 2006 due to mitral stenosis      from rheumatic heart disease and at the same time had bypass      surgery done as well.  3. She had a hysterectomy in 1965.  4. Appendectomy 1975.  5. Gallbladder in 1975.  6. Back surgery in 1961.  7. She had rapid atrial fibrillation following her surgery and had      pacemaker placement and amiodarone started at that time in 2006.      8.  She had a  stroke possibly in 2004.   MEDICATION ALLERGIES:  MORPHINE causes itching.   CURRENT MEDICATIONS:  1. Pacerone 100 mg daily.  2. Stool softener daily.  3. Potassium 20 mEq daily.  4. Metoprolol 25 mg daily.  5. Warfarin daily.  6. Furosemide 40 mg daily.   FAMILY HISTORY:  Heart disease in father.  Rheumatism in father and  mother.  Mother had leukemia.  She lives by herself.  She is widowed.  She has children.   REVIEW OF SYSTEMS:  Otherwise noncontributory.   PHYSICAL EXAMINATION:  VITAL SIGNS:  Temperature 97.8.  Blood pressure  108/80. Pulse 81.  Oxygen saturation 98% on room air.  CHEST:  Showed distant breath sounds.  Prolonged expiratory phase.  No  wheeze, rale or rhonchi.  CARDIAC:  Exam showed a regular rate and rhythm without S3. Normal S1  and S2.  ABDOMEN:  Soft, nontender.  EXTREMITIES:  Showed no edema, clubbing or venous disease.  SKIN:  Clear.  NEUROLOGICAL:  Exam was intact.  HEENT:  Exam showed no jugular venous distention, lymphadenopathy.  Oropharynx is clear.  NECK:  Supple.   LABORATORY DATA:  Chest x-ray in December, 2006 showed permanent  pacemaker, no active lung disease.  Pulmonary functions both in January  of 2007 and in January 2008 revealed diffusion capacity of 56%  predicted.  Spirometry is normal and lung volumes are normal.   IMPRESSION:  Isolated reduction in diffusion capacity, likely from  pulmonary capillary blood volume issues and not from amiodarone  toxicity.  However, to be complete, a high resolution CT scan of the  chest will be performed on May 08, 2006.  Once the results of this area  available, further recommendations will follow.     Charlcie Cradle Delford Field, MD, [facility]  Electronically Signed    PEW/MedQ  DD: 05/08/2006  DT: 05/08/2006  Job #: [id number]

## 2010-12-06 LAB — HEMOGLOBIN AND HEMATOCRIT, BLOOD
HCT: 22.4 — ABNORMAL LOW
HCT: 24.5 — ABNORMAL LOW
Hemoglobin: 7.7 — CL
Hemoglobin: 8 — ABNORMAL LOW

## 2010-12-06 LAB — PROTIME-INR
INR: 2.1 — ABNORMAL HIGH
INR: 2.4 — ABNORMAL HIGH
INR: 2.7 — ABNORMAL HIGH
Prothrombin Time: 26.5 — ABNORMAL HIGH
Prothrombin Time: 27.2 — ABNORMAL HIGH
Prothrombin Time: 43.1 — ABNORMAL HIGH

## 2010-12-06 LAB — COMPREHENSIVE METABOLIC PANEL
ALT: 16
AST: 27
Albumin: 3.6
CO2: 24
Chloride: 100
GFR calc Af Amer: 35 — ABNORMAL LOW
GFR calc non Af Amer: 29 — ABNORMAL LOW
Potassium: 3.8
Sodium: 136
Total Bilirubin: 0.7

## 2010-12-06 LAB — BASIC METABOLIC PANEL
BUN: 32 — ABNORMAL HIGH
BUN: 32 — ABNORMAL HIGH
CO2: 28
CO2: 31
Calcium: 8.3 — ABNORMAL LOW
Calcium: 8.4
Chloride: 102
Creatinine, Ser: 1.62 — ABNORMAL HIGH
GFR calc Af Amer: 37 — ABNORMAL LOW
Glucose, Bld: 108 — ABNORMAL HIGH
Glucose, Bld: 118 — ABNORMAL HIGH
Sodium: 136

## 2010-12-06 LAB — CROSSMATCH: Antibody Screen: NEGATIVE

## 2010-12-06 LAB — PREPARE FRESH FROZEN PLASMA

## 2010-12-06 LAB — CBC
HCT: 25.5 — ABNORMAL LOW
HCT: 25.9 — ABNORMAL LOW
HCT: 26.3 — ABNORMAL LOW
Hemoglobin: 8.5 — ABNORMAL LOW
Hemoglobin: 8.7 — ABNORMAL LOW
MCHC: 33.6
MCHC: 33.7
MCHC: 34.7
MCV: 89.3
MCV: 91.2
Platelets: 148 — ABNORMAL LOW
Platelets: 158
Platelets: 185
Platelets: 185
RDW: 15.3
RDW: 15.3
WBC: 5.1

## 2010-12-06 LAB — ABO/RH: ABO/RH(D): A POS

## 2010-12-06 LAB — DIFFERENTIAL
Basophils Absolute: 0
Eosinophils Absolute: 0
Eosinophils Relative: 0
Monocytes Absolute: 0.9

## 2010-12-12 LAB — LUPUS ANTICOAGULANT PANEL
PTT Lupus Anticoagulant: 90.8 — ABNORMAL HIGH (ref 36.3–48.8)
PTTLA 4:1 Mix: 73.9 — ABNORMAL HIGH (ref 36.3–48.8)
PTTLA Confirmation: 11.2 — ABNORMAL HIGH (ref <8.0)
dRVVT Incubated 1:1 Mix: 39.7 (ref 36.1–47.0)

## 2010-12-12 LAB — BASIC METABOLIC PANEL
BUN: 32 — ABNORMAL HIGH
BUN: 35 — ABNORMAL HIGH
BUN: 38 — ABNORMAL HIGH
BUN: 38 — ABNORMAL HIGH
CO2: 29
CO2: 31
Calcium: 8.7
Calcium: 8.7
Calcium: 8.8
Chloride: 102
Chloride: 98
Chloride: 99
Creatinine, Ser: 1.48 — ABNORMAL HIGH
Creatinine, Ser: 1.56 — ABNORMAL HIGH
Creatinine, Ser: 1.67 — ABNORMAL HIGH
GFR calc Af Amer: 41 — ABNORMAL LOW
GFR calc non Af Amer: 31 — ABNORMAL LOW
GFR calc non Af Amer: 34 — ABNORMAL LOW
Glucose, Bld: 124 — ABNORMAL HIGH
Glucose, Bld: 99
Potassium: 3.5
Potassium: 3.6
Sodium: 138

## 2010-12-12 LAB — URINE MICROSCOPIC-ADD ON

## 2010-12-12 LAB — CARDIOLIPIN ANTIBODIES, IGG, IGM, IGA
Anticardiolipin IgA: <7 — ABNORMAL LOW (ref <13)
Anticardiolipin IgG: <7 — ABNORMAL LOW (ref <11)
Anticardiolipin IgM: <7 — ABNORMAL LOW (ref <10)

## 2010-12-12 LAB — DIFFERENTIAL
Basophils Absolute: 0
Basophils Relative: 0
Eosinophils Absolute: 0
Neutro Abs: 5.9
Neutrophils Relative %: 85 — ABNORMAL HIGH

## 2010-12-12 LAB — B-NATRIURETIC PEPTIDE (CONVERTED LAB): Pro B Natriuretic peptide (BNP): 334 — ABNORMAL HIGH

## 2010-12-12 LAB — I-STAT 8, (EC8 V) (CONVERTED LAB)
Acid-Base Excess: 5 — ABNORMAL HIGH
Bicarbonate: 27.1 — ABNORMAL HIGH
Operator id: 294501
TCO2: 28
pCO2, Ven: 31.3 — ABNORMAL LOW
pH, Ven: 7.544 — ABNORMAL HIGH

## 2010-12-12 LAB — PROTEIN, URINE, 24 HOUR

## 2010-12-12 LAB — ANTITHROMBIN III: AntiThromb III Func: 116 (ref 76–126)

## 2010-12-12 LAB — URINALYSIS, ROUTINE W REFLEX MICROSCOPIC
Specific Gravity, Urine: 1.012
Urobilinogen, UA: 1
pH: 8

## 2010-12-12 LAB — CBC
MCHC: 33.3
MCV: 89.7
Platelets: 224

## 2010-12-12 LAB — PROTIME-INR
INR: 1.9 — ABNORMAL HIGH
INR: 1.9 — ABNORMAL HIGH
INR: 2 — ABNORMAL HIGH
INR: 2.5 — ABNORMAL HIGH
Prothrombin Time: 22 — ABNORMAL HIGH
Prothrombin Time: 23.2 — ABNORMAL HIGH
Prothrombin Time: 27.6 — ABNORMAL HIGH

## 2010-12-12 LAB — PROTEIN, URINE, RANDOM: Total Protein, Urine: 8

## 2010-12-12 LAB — BETA-2-GLYCOPROTEIN I ABS, IGG/M/A: Beta-2 Glyco I IgG: <4 U/mL (ref <20)

## 2010-12-12 LAB — PROTHROMBIN GENE MUTATION

## 2010-12-12 LAB — PROTEIN S, TOTAL: Protein S Ag, Total: 73 % (ref 70–140)

## 2010-12-12 LAB — POCT I-STAT CREATININE: Operator id: 294501

## 2010-12-13 LAB — BASIC METABOLIC PANEL
BUN: 40 — ABNORMAL HIGH
BUN: 41 — ABNORMAL HIGH
BUN: 43 — ABNORMAL HIGH
CO2: 30
CO2: 32
Calcium: 8.4
Chloride: 102
Chloride: 103
Creatinine, Ser: 1.73 — ABNORMAL HIGH
GFR calc Af Amer: 34 — ABNORMAL LOW
GFR calc non Af Amer: 28 — ABNORMAL LOW
Glucose, Bld: 111 — ABNORMAL HIGH
Potassium: 3.4 — ABNORMAL LOW
Potassium: 3.6
Sodium: 141
Sodium: 142

## 2010-12-13 LAB — B-NATRIURETIC PEPTIDE (CONVERTED LAB): Pro B Natriuretic peptide (BNP): 527 — ABNORMAL HIGH

## 2010-12-13 LAB — URINE MICROSCOPIC-ADD ON

## 2010-12-13 LAB — C3 COMPLEMENT: C3 Complement: 98

## 2010-12-13 LAB — DIFFERENTIAL
Basophils Absolute: 0
Eosinophils Absolute: 0
Eosinophils Relative: 0
Lymphocytes Relative: 11 — ABNORMAL LOW

## 2010-12-13 LAB — CBC
HCT: 35.6 — ABNORMAL LOW
MCV: 90.2
Platelets: 238
RDW: 16.3 — ABNORMAL HIGH

## 2010-12-13 LAB — COMPREHENSIVE METABOLIC PANEL
ALT: 29
CO2: 26
Calcium: 8.9
Creatinine, Ser: 1.92 — ABNORMAL HIGH
GFR calc non Af Amer: 25 — ABNORMAL LOW
Glucose, Bld: 148 — ABNORMAL HIGH
Sodium: 142

## 2010-12-13 LAB — I-STAT 8, (EC8 V) (CONVERTED LAB)
Acid-base deficit: 3 — ABNORMAL HIGH
Chloride: 105
HCT: 39
Operator id: 284251
Potassium: 3.8

## 2010-12-13 LAB — CARDIAC PANEL(CRET KIN+CKTOT+MB+TROPI)
CK, MB: 3.2
Relative Index: INVALID
Troponin I: 0.04

## 2010-12-13 LAB — URINALYSIS, ROUTINE W REFLEX MICROSCOPIC
Bilirubin Urine: NEGATIVE
Nitrite: NEGATIVE
Specific Gravity, Urine: 1.013
pH: 5.5

## 2010-12-13 LAB — PROTIME-INR
INR: 4 — ABNORMAL HIGH
Prothrombin Time: 37.3 — ABNORMAL HIGH

## 2010-12-13 LAB — PROTEIN ELECTROPH W RFLX QUANT IMMUNOGLOBULINS
Albumin ELP: 60.2
Beta 2: 4.4
Beta Globulin: 7
Gamma Globulin: 13

## 2010-12-13 LAB — MAGNESIUM: Magnesium: 2.2

## 2010-12-13 LAB — TSH: TSH: 3.563

## 2010-12-13 LAB — POCT I-STAT CREATININE: Creatinine, Ser: 2.4 — ABNORMAL HIGH

## 2010-12-13 LAB — UREA NITROGEN, URINE: Urea Nitrogen, Ur: 204

## 2010-12-13 LAB — CK TOTAL AND CKMB (NOT AT ARMC): Relative Index: INVALID

## 2010-12-13 LAB — C4 COMPLEMENT: Complement C4, Body Fluid: 21

## 2010-12-13 LAB — AMIODARONE LEVEL: Amiodarone Lvl: 0.14 ug/mL — ABNORMAL LOW (ref 0.50–2.00)

## 2011-05-15 ENCOUNTER — Encounter: Payer: Self-pay | Admitting: Internal Medicine

## 2011-05-26 ENCOUNTER — Encounter: Payer: Self-pay | Admitting: Internal Medicine

## 2011-05-26 ENCOUNTER — Ambulatory Visit (INDEPENDENT_AMBULATORY_CARE_PROVIDER_SITE_OTHER): Payer: Medicare Other | Admitting: Internal Medicine

## 2011-05-26 VITALS — BP 129/63 | HR 68 | Resp 18 | Ht 62.0 in

## 2011-05-26 DIAGNOSIS — R001 Bradycardia, unspecified: Secondary | ICD-10-CM

## 2011-05-26 DIAGNOSIS — I4891 Unspecified atrial fibrillation: Secondary | ICD-10-CM | POA: Insufficient documentation

## 2011-05-26 DIAGNOSIS — I498 Other specified cardiac arrhythmias: Secondary | ICD-10-CM

## 2011-05-26 NOTE — Assessment & Plan Note (Signed)
She has permanent rate controlled valvular afib.  She will continue lifelong anticoagulation with coumadin No other changes today

## 2011-05-26 NOTE — Assessment & Plan Note (Signed)
The patient has permanent atrial fibrillation and symptomatic bradycardia.  She has reached ERI battery status. Risks, benefits, and alternatives to PPM pulse generator replacement were discussed at length with the patient who wishes to proceed.  We will therefore proceed with PPM generator change at the next available time.
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
## 2011-05-28 ENCOUNTER — Other Ambulatory Visit: Payer: Self-pay | Admitting: *Deleted

## 2011-05-28 ENCOUNTER — Encounter: Payer: Self-pay | Admitting: *Deleted

## 2011-05-28 DIAGNOSIS — I4891 Unspecified atrial fibrillation: Secondary | ICD-10-CM

## 2011-05-28 DIAGNOSIS — R001 Bradycardia, unspecified: Secondary | ICD-10-CM

## 2011-06-03 ENCOUNTER — Other Ambulatory Visit: Payer: Self-pay | Admitting: Family Medicine

## 2011-06-03 DIAGNOSIS — R413 Other amnesia: Secondary | ICD-10-CM

## 2011-06-04 ENCOUNTER — Encounter (HOSPITAL_COMMUNITY): Payer: Self-pay | Admitting: Pharmacy Technician

## 2011-06-05 ENCOUNTER — Ambulatory Visit
Admission: RE | Admit: 2011-06-05 | Discharge: 2011-06-05 | Disposition: A | Discharge status: Home / Self Care | Payer: Medicare Other | Source: Ambulatory Visit | Attending: Family Medicine | Admitting: Family Medicine

## 2011-06-05 DIAGNOSIS — R413 Other amnesia: Secondary | ICD-10-CM

## 2011-06-05 MED ORDER — IOHEXOL 300 MG/ML  SOLN
75.0000 mL | Freq: Once | INTRAMUSCULAR | Status: AC | PRN
  Administered 2011-06-05: 75 mL via INTRAVENOUS

## 2011-06-12 MED ORDER — SODIUM CHLORIDE 0.9 % IR SOLN
80.0000 mg | Status: AC
  Administered 2011-06-13: 80 mg
  Filled 2011-06-12 (×2): qty 2

## 2011-06-12 MED ORDER — CEFAZOLIN SODIUM 1-5 GM-% IV SOLN
1.0000 g | INTRAVENOUS | Status: AC
  Administered 2011-06-13: 1 g via INTRAVENOUS

## 2011-06-13 ENCOUNTER — Encounter (HOSPITAL_COMMUNITY)
Admission: RE | Disposition: A | Discharge status: Home / Self Care | Payer: Self-pay | Source: Ambulatory Visit | Attending: Internal Medicine

## 2011-06-13 ENCOUNTER — Ambulatory Visit (HOSPITAL_COMMUNITY)
Admission: RE | Admit: 2011-06-13 | Discharge: 2011-06-13 | Disposition: A | Discharge status: Home / Self Care | Payer: Medicare Other | Source: Ambulatory Visit | Attending: Internal Medicine | Admitting: Internal Medicine

## 2011-06-13 DIAGNOSIS — Z45018 Encounter for adjustment and management of other part of cardiac pacemaker: Secondary | ICD-10-CM | POA: Insufficient documentation

## 2011-06-13 DIAGNOSIS — I498 Other specified cardiac arrhythmias: Secondary | ICD-10-CM

## 2011-06-13 DIAGNOSIS — R001 Bradycardia, unspecified: Secondary | ICD-10-CM

## 2011-06-13 DIAGNOSIS — M1990 Unspecified osteoarthritis, unspecified site: Secondary | ICD-10-CM | POA: Insufficient documentation

## 2011-06-13 DIAGNOSIS — Z993 Dependence on wheelchair: Secondary | ICD-10-CM | POA: Insufficient documentation

## 2011-06-13 DIAGNOSIS — F0390 Unspecified dementia without behavioral disturbance: Secondary | ICD-10-CM | POA: Insufficient documentation

## 2011-06-13 DIAGNOSIS — E669 Obesity, unspecified: Secondary | ICD-10-CM | POA: Insufficient documentation

## 2011-06-13 DIAGNOSIS — I4891 Unspecified atrial fibrillation: Secondary | ICD-10-CM | POA: Insufficient documentation

## 2011-06-13 HISTORY — PX: PACEMAKER GENERATOR CHANGE: SHX5481

## 2011-06-13 LAB — BASIC METABOLIC PANEL
BUN: 46 mg/dL — ABNORMAL HIGH (ref 6–23)
CO2: 25 mEq/L (ref 19–32)
Calcium: 9.1 mg/dL (ref 8.4–10.5)
Chloride: 105 mEq/L (ref 96–112)
Creatinine, Ser: 1.1 mg/dL (ref 0.50–1.10)
GFR calc Af Amer: 52 mL/min — ABNORMAL LOW (ref >90)
GFR calc non Af Amer: 45 mL/min — ABNORMAL LOW (ref >90)
Glucose, Bld: 106 mg/dL — ABNORMAL HIGH (ref 70–99)
Potassium: 4.1 mEq/L (ref 3.5–5.1)
Sodium: 141 mEq/L (ref 135–145)

## 2011-06-13 LAB — PROTIME-INR
INR: 1.91 — ABNORMAL HIGH (ref 0.00–1.49)
Prothrombin Time: 22.2 seconds — ABNORMAL HIGH (ref 11.6–15.2)

## 2011-06-13 LAB — CBC
Hemoglobin: 11.5 g/dL — ABNORMAL LOW (ref 12.0–15.0)
MCHC: 32 g/dL (ref 30.0–36.0)
Platelets: 169 10*3/uL (ref 150–400)
RBC: 3.92 MIL/uL (ref 3.87–5.11)

## 2011-06-13 LAB — SURGICAL PCR SCREEN
MRSA, PCR: NEGATIVE
Staphylococcus aureus: POSITIVE — AB

## 2011-06-13 SURGERY — PACEMAKER GENERATOR CHANGE
Anesthesia: LOCAL

## 2011-06-13 MED ORDER — CHLORHEXIDINE GLUCONATE 4 % EX LIQD
60.0000 mL | Freq: Once | CUTANEOUS | Status: DC
  Filled 2011-06-13: qty 60

## 2011-06-13 MED ORDER — ONDANSETRON HCL 4 MG/2ML IJ SOLN: 4.0000 mg | Freq: Four times a day (QID) | INTRAMUSCULAR | Status: DC | PRN

## 2011-06-13 MED ORDER — ACETAMINOPHEN 325 MG PO TABS: 325.0000 mg | ORAL_TABLET | ORAL | Status: DC | PRN

## 2011-06-13 MED ORDER — SODIUM CHLORIDE 0.9 % IV SOLN: INTRAVENOUS | Status: DC

## 2011-06-13 MED ORDER — LIDOCAINE HCL (PF) 1 % IJ SOLN
INTRAMUSCULAR | Status: AC
  Filled 2011-06-13: qty 60

## 2011-06-13 MED ORDER — CEFAZOLIN SODIUM 1-5 GM-% IV SOLN
INTRAVENOUS | Status: AC
  Filled 2011-06-13: qty 100

## 2011-06-13 MED ORDER — MUPIROCIN 2 % EX OINT
TOPICAL_OINTMENT | Freq: Two times a day (BID) | CUTANEOUS | Status: DC
  Filled 2011-06-13: qty 22

## 2011-06-13 MED ORDER — FENTANYL CITRATE 0.05 MG/ML IJ SOLN
INTRAMUSCULAR | Status: AC
  Filled 2011-06-13: qty 2

## 2011-06-13 MED ORDER — MIDAZOLAM HCL 5 MG/5ML IJ SOLN
INTRAMUSCULAR | Status: AC
  Filled 2011-06-13: qty 5

## 2011-06-13 MED ORDER — MUPIROCIN 2 % EX OINT
TOPICAL_OINTMENT | CUTANEOUS | Status: AC
  Filled 2011-06-13: qty 22

## 2011-06-13 MED ORDER — HYDROCODONE-ACETAMINOPHEN 5-325 MG PO TABS: 1.0000 | ORAL_TABLET | ORAL | Status: DC | PRN

## 2011-06-13 NOTE — Interval H&P Note (Signed)
History and Physical Interval Note:  06/13/2011 2:52 PM  Sherry Howell  has presented today for surgery, with the diagnosis of End of life  The various methods of treatment have been discussed with the patient and family. After consideration of risks, benefits and other options for treatment, the patient has consented to  Procedure(s) (LRB): PACEMAKER GENERATOR CHANGE (N/A) as a surgical intervention .  The patients' history has been reviewed, patient examined, no change in status, stable for surgery.  I have reviewed the patients' chart and labs.  Questions were answered to the patient's satisfaction.     Hillis Range

## 2011-06-13 NOTE — Op Note (Signed)
SURGEON:  Hillis Range, MD     PREPROCEDURE DIAGNOSES:   1. Permanent Atrial fibrillation.   2. Symptomatic bradycardia    POSTPROCEDURE DIAGNOSES:   1. Permanent Atrial fibrillation.   2. Symptomatic bradycardia    PROCEDURES:   1. Pacemaker pulse generator replacement.   2. Skin pocket revision.     INTRODUCTION:  Sherry Howell is a 78 y.o. female with a history of symptomatic bradycardia. She has done well since his pacemaker was implanted.  She now has permanent afib. He has recently reached EOL battery status.  She presents today for pacemaker pulse generator replacement.       DESCRIPTION OF THE PROCEDURE:  Informed written consent was obtained, and the patient was brought to the electrophysiology lab in the fasting state.  The patient's pacemaker was interrogated today and found to be at end of life battery status.  The patient required no sedation for the procedure today.  The patient's left chest was prepped and draped in the usual sterile fashion by the EP lab staff.  The skin overlying the existing pacemaker was infiltrated with lidocaine for local analgesia.  A 4-cm incision was made over the pacemaker pocket.  Using a combination of sharp and blunt dissection, the pacemaker was exposed and removed from the body.  The device was disconnected from the leads. There was no foreign matter or debris within the pocket.  The atrial lead was confirmed to be a Medtronic model [id number] (serial number [id number]) lead implanted on 04/11/04.  The right ventricular lead was confirmed to be a Medtronic model [id number] (serial number [id number]) lead implanted on the same date as the atrial lead (above).  Both leads were examined and their integrity was confirmed to be intact.  Atrial fib waves measured 1.6 mV with impedance of 435 ohms.  Because of permanent afib, this lead was capped off and returned to the pocket.  Right ventricular lead R-waves measured 9mV (unipolar), 2.77mV (bipolar) with impedance  of 426 ohms and a threshold of 0.7 V at 0.5 msec.  The ventricular lead was connected to a Medtronic [place] model [id number] (serial number [id number]) pacemaker.  The pocket was revised to accommodate this new device.  Electrocautery was required to assure hemostasis.  The pocket was irrigated with copious gentamicin solution. The pacemaker was then placed into the pocket.  The pocket was then closed in 2 layers with 2-0 Vicryl suture over the subcutaneous and subcuticular layers.  Steri-Strips and a sterile dressing were then applied.  There were no early apparent complications.     CONCLUSIONS:   1. Successful pacemaker pulse generator replacement for end of life battery status   2. No early apparent complications.     Hillis Range, MD 06/13/2011 3:53 PM

## 2011-06-13 NOTE — Discharge Instructions (Signed)
Pacemaker Battery Change A pacemaker battery usually lasts 4 to 12 years. Once or twice per year, you will be asked to visit your caregiver to have a full evaluation of your pacemaker. When a battery needs to be replaced, the entire pacemaker is actually replaced so that you can benefit from new circuitry and any new features that have recently been added to pacemakers. Most often, this procedure is very simple because the leads are already in place. After giving medicine to numb the skin, your health care provider makes a cut to reopen the pocket holding the pacemaker and disconnects the old device from its leads. The leads are routinely tested at this time. If they are working okay, the new pacemaker may simply be connected to the existing leads. If there is any problem with the old lead system, it may be wise to replace the lead system while inserting the new pacemaker. There are many things that affect how long a pacemaker battery will last:  Age of the pacemaker.   Number of leads (1, 2 or 3).   Pacemaker work load. If the pacemaker is helping the heart more often, then the battery will not last as long as if the pacemaker does not need to help the heart.   Resistance of the leads. The greater the resistance, the greater the drain on the battery. This can increase as the leads get older or if one or more of the leads does not have the best contact with the heart.   Power (voltage) settings.   The health of the person's heart. If the health of the heart gets worse, then the pacemaker may have to work more often and the setting changed to accommodate these changes.  Your health care provider will be alerted to the fact that it is time to replace the battery during follow-up exams. He or she will check your pacemaker using a small table-top computer, called a programmer, and a wand. The wand is about the same size as a remote control. Your provider puts the wand on your body in the area where the  pacemaker is located. Information from the pacemaker is received about how well your heart is working and the status of the battery. It is not painful, and it usually takes just a few minutes. You will have plenty of time before the battery is fully used up to plan for replacement.  LET YOUR CAREGIVER KNOW ABOUT:   Symptoms of chest pain, trouble breathing, palpitations, lightheadedness, or feelings of an abnormal or irregular heart beat.   Allergies.   Medications taken including herbs, eye drops, over the counter medications, and creams   Use of steroids (by mouth or creams).   Possible pregnancy, if applicable.   Previous problems with anesthetics or Novocaine.   History of blood clots (thrombophlebitis).   History of bleeding or blood problems.   Surgery since your last pacemaker placement.   Other health problems.  RISKS AND COMPLICATIONS These are very uncommon but include:  Bleeding.   Bruising of the skin around where the incision was made.   Pain at the site of the incision.   Pulling apart of the skin at the incision site.   Infection.   Allergic reaction to anesthetics or medicines used during the procedure.  Diabetics may have a temporary increase in their blood sugar after any surgical procedure.  BEFORE THE PROCEDURE  Wash all of the skin around the area of the chest where the pacemaker is located.   Try to remove any loose, scaling skin. Unless advised otherwise, avoid using aspirin, ibuprofen, or naproxen for 3-4 days before the procedure. Ask your caregiver for help with any other medication adjustments before the pacemaker is replaced. Unless advised otherwise, do not eat or drink after midnight on the night before the procedure EXCEPT for drinking water and taking your medications as you normally would. AFTER THE PROCEDURE   A heart monitor and the pacemaker programmer will be used to make sure that the new pacemaker is working properly.   You can go home  after the procedure.   Your caregiver will advise you if you need to have any stitches. They will be removed 5-7 days after the procedure.  HOME CARE INSTRUCTIONS   Keep the incision clean and dry.   Unless advised otherwise, you may shower after carefully covering the incision with plastic wrap that is taped to your chest.   For the first week after the replacement, avoid stretching motions that pull at the incision site and avoid heavy exercise with the arm on the same side as the incision.   Only take over-the-counter or prescription medicines for pain, discomfort, or fever as directed by your caregiver.   Your caregiver will tell you when you will need to next test your pacemaker by telephone or when to return to the office for re-exam and/or removal of stitches, if necessary.  SEEK MEDICAL CARE IF:   You have unusual pain at the incision site that is not adequately helped by over-the-counter or prescription medicine.   There is drainage or pus from the incision site.   You develop red streaking that extends above or below the incision site.   You feel brief intermittent palpitations, lightheadedness or any symptoms that you feel might be related to your heart.  SEEK IMMEDIATE MEDICAL CARE IF:   You experience chest pain that is different than the pain at the incision site.   You experience:   Shortness of breath.   Palpitations.   Irregular heart beat.   Lightheadedness that does not go away quickly.   Fainting.   You develop a fever.   You have pain that gets worse even though you are taking pain medicine.  MAKE SURE YOU:   Understand these instructions.   Will watch your condition.   Will get help right away if you are not doing well or get worse.  Document Released: 05/28/2006 Document Revised: 02/06/2011 Document Reviewed: 08/31/2006 Exit[employer] Patient Information 2012 [employer]. 
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
## 2011-06-21 ENCOUNTER — Emergency Department (HOSPITAL_COMMUNITY): Payer: Medicare Other

## 2011-06-21 ENCOUNTER — Encounter (HOSPITAL_COMMUNITY): Payer: Self-pay | Admitting: Emergency Medicine

## 2011-06-21 ENCOUNTER — Other Ambulatory Visit (HOSPITAL_COMMUNITY): Payer: Medicare Other

## 2011-06-21 ENCOUNTER — Emergency Department (HOSPITAL_COMMUNITY)
Admission: EM | Admit: 2011-06-21 | Discharge: 2011-06-21 | Disposition: A | Discharge status: Skilled Nursing Facility | Payer: Medicare Other | Attending: Emergency Medicine | Admitting: Emergency Medicine

## 2011-06-21 DIAGNOSIS — I10 Essential (primary) hypertension: Secondary | ICD-10-CM | POA: Insufficient documentation

## 2011-06-21 DIAGNOSIS — R0602 Shortness of breath: Secondary | ICD-10-CM | POA: Insufficient documentation

## 2011-06-21 DIAGNOSIS — E669 Obesity, unspecified: Secondary | ICD-10-CM | POA: Insufficient documentation

## 2011-06-21 DIAGNOSIS — R4182 Altered mental status, unspecified: Secondary | ICD-10-CM | POA: Insufficient documentation

## 2011-06-21 DIAGNOSIS — G319 Degenerative disease of nervous system, unspecified: Secondary | ICD-10-CM | POA: Insufficient documentation

## 2011-06-21 DIAGNOSIS — Z79899 Other long term (current) drug therapy: Secondary | ICD-10-CM | POA: Insufficient documentation

## 2011-06-21 DIAGNOSIS — N390 Urinary tract infection, site not specified: Secondary | ICD-10-CM

## 2011-06-21 DIAGNOSIS — F0390 Unspecified dementia without behavioral disturbance: Secondary | ICD-10-CM | POA: Insufficient documentation

## 2011-06-21 HISTORY — DX: Disorder of kidney and ureter, unspecified: N28.9

## 2011-06-21 HISTORY — DX: Essential (primary) hypertension: I10

## 2011-06-21 LAB — DIFFERENTIAL
Basophils Absolute: 0 10*3/uL (ref 0.0–0.1)
Basophils Relative: 0 % (ref 0–1)
Eosinophils Absolute: 0 10*3/uL (ref 0.0–0.7)
Lymphocytes Relative: 9 % — ABNORMAL LOW (ref 12–46)
Monocytes Absolute: 0.5 10*3/uL (ref 0.1–1.0)
Neutro Abs: 5 10*3/uL (ref 1.7–7.7)

## 2011-06-21 LAB — URINALYSIS, ROUTINE W REFLEX MICROSCOPIC
Glucose, UA: NEGATIVE mg/dL
Ketones, ur: NEGATIVE mg/dL
Nitrite: POSITIVE — AB
Specific Gravity, Urine: 1.018 (ref 1.005–1.030)
pH: 5.5 (ref 5.0–8.0)

## 2011-06-21 LAB — COMPREHENSIVE METABOLIC PANEL
Albumin: 3.1 g/dL — ABNORMAL LOW (ref 3.5–5.2)
Alkaline Phosphatase: 87 U/L (ref 39–117)
BUN: 38 mg/dL — ABNORMAL HIGH (ref 6–23)
Calcium: 8.6 mg/dL (ref 8.4–10.5)
Creatinine, Ser: 1.18 mg/dL — ABNORMAL HIGH (ref 0.50–1.10)
GFR calc Af Amer: 48 mL/min — ABNORMAL LOW (ref >90)
Glucose, Bld: 158 mg/dL — ABNORMAL HIGH (ref 70–99)
Potassium: 3.9 mEq/L (ref 3.5–5.1)
Total Protein: 6 g/dL (ref 6.0–8.3)

## 2011-06-21 LAB — PROTIME-INR
INR: 1.86 — ABNORMAL HIGH (ref 0.00–1.49)
Prothrombin Time: 21.8 seconds — ABNORMAL HIGH (ref 11.6–15.2)

## 2011-06-21 LAB — URINE MICROSCOPIC-ADD ON

## 2011-06-21 LAB — CBC
HCT: 32.1 % — ABNORMAL LOW (ref 36.0–46.0)
MCH: 29.6 pg (ref 26.0–34.0)
MCHC: 31.8 g/dL (ref 30.0–36.0)
RDW: 14.9 % (ref 11.5–15.5)

## 2011-06-21 LAB — POCT I-STAT TROPONIN I

## 2011-06-21 MED ORDER — CEPHALEXIN 500 MG PO CAPS: 500.0000 mg | ORAL_CAPSULE | Freq: Four times a day (QID) | ORAL | Status: AC

## 2011-06-21 MED ORDER — SODIUM CHLORIDE 0.9 % IV SOLN
Freq: Once | INTRAVENOUS | Status: AC
  Administered 2011-06-21: 16:00:00 via INTRAVENOUS

## 2011-06-21 MED ORDER — DEXTROSE 5 % IV SOLN
1.0000 g | INTRAVENOUS | Status: DC
  Administered 2011-06-21: 1 g via INTRAVENOUS
  Filled 2011-06-21: qty 10

## 2011-06-21 MED ORDER — FUROSEMIDE 10 MG/ML IJ SOLN
40.0000 mg | Freq: Once | INTRAMUSCULAR | Status: AC
  Administered 2011-06-21: 40 mg via INTRAVENOUS
  Filled 2011-06-21: qty 4

## 2011-06-21 NOTE — ED Notes (Signed)
Patient unable to stand another tech present in room

## 2011-06-21 NOTE — ED Notes (Signed)
Per EMS: Pt has been having a low grade fever for the past two days.  This morning daughter states that she was acting confused.  Hx of chronic UTI.  Finished abx 2 wks ago for most recent infection.  Started Namenda a wk from yesterday.  Daughter thinks her confusion might have something to do with this new med.

## 2011-06-21 NOTE — Discharge Instructions (Signed)
Urinary Tract Infection Infections of the urinary tract can start in several places. A bladder infection (cystitis), a kidney infection (pyelonephritis), and a prostate infection (prostatitis) are different types of urinary tract infections (UTIs). They usually get better if treated with medicines (antibiotics) that kill germs. Take all the medicine until it is gone. You or your child may feel better in a few days, but TAKE ALL MEDICINE or the infection may not respond and may become more difficult to treat. HOME CARE INSTRUCTIONS   Drink enough water and fluids to keep the urine clear or pale yellow. Cranberry juice is especially recommended, in addition to large amounts of water.   Avoid caffeine, tea, and carbonated beverages. They tend to irritate the bladder.   Alcohol may irritate the prostate.   Only take over-the-counter or prescription medicines for pain, discomfort, or fever as directed by your caregiver.  To prevent further infections:  Empty the bladder often. Avoid holding urine for long periods of time.   After a bowel movement, women should cleanse from front to back. Use each tissue only once.   Empty the bladder before and after sexual intercourse.  FINDING OUT THE RESULTS OF YOUR TEST Not all test results are available during your visit. If your or your child's test results are not back during the visit, make an appointment with your caregiver to find out the results. Do not assume everything is normal if you have not heard from your caregiver or the medical facility. It is important for you to follow up on all test results. SEEK MEDICAL CARE IF:   There is back pain.   Your baby is older than 3 months with a rectal temperature of 100.5 F (38.1 C) or higher for more than 1 day.   Your or your child's problems (symptoms) are no better in 3 days. Return sooner if you or your child is getting worse.  SEEK IMMEDIATE MEDICAL CARE IF:   There is severe back pain or lower  abdominal pain.   You or your child develops chills.   You have a fever.   Your baby is older than 3 months with a rectal temperature of 102 F (38.9 C) or higher.   Your baby is 3 months old or younger with a rectal temperature of 100.4 F (38 C) or higher.   There is nausea or vomiting.   There is continued burning or discomfort with urination.  MAKE SURE YOU:   Understand these instructions.   Will watch your condition.   Will get help right away if you are not doing well or get worse.  Document Released: 11/27/2004 Document Revised: 02/06/2011 Document Reviewed: 07/02/2006 Exit[employer] Patient Information 2012 [employer]. 

## 2011-06-21 NOTE — ED Notes (Signed)
Patient discharge via stretcher by [facility]. Respirations equal and unlabored. Skin warm and dry. No acute distress noted.

## 2011-06-21 NOTE — ED Notes (Addendum)
Pt is oriented to self but not place, task, or month.  Hx of dementia.  Unsure if this is her baseline.

## 2011-06-21 NOTE — ED Provider Notes (Signed)
History     CSN: [id number]  Arrival date & time 06/21/11  1357   First MD Initiated Contact with Patient 06/21/11 1503      Chief Complaint  Patient presents with  . Altered Mental Status    (Consider location/radiation/quality/duration/timing/severity/associated sxs/prior treatment) Patient is a 78 y.o. female presenting with altered mental status. The history is provided by the patient and a relative.  Altered Mental Status   patient here with her daughter with altered mental status and increased weakness with a low-grade fever for 2 days. Patient does have a history of chronic UTIs and just finished antibiotics. No vomiting, diarrhea. No rashes noted. No dyspnea or cough. Temperature at home was up to 99.5. Patient recently started on Namenda for her history of dementia. Her daughter is concerned that her current mental status changes is from this. Nothing makes her symptoms better or worse and no medications given for this prior to arrival  Past Medical History  Diagnosis Date  . Permanent atrial fibrillation   . Dementia     mild  . Bradycardia     s/p PPM  . DJD (degenerative joint disease)     ambulates by wheelchair  . Diastolic dysfunction   . Venous stasis ulcer     L leg, slow healing  . Obesity   . Hypertension   . Renal disorder     Past Surgical History  Procedure Date  . Pacemaker insertion 04/11/04    MDT implanted by [name]  . Mitral valve replacement 04/03/04    Mitral valve replacement with a 24 mm mechanical ATS valve.  . Cholecystectomy   . Appendectomy   . Laminectomy   . Abdominal hysterectomy     No family history on file.  History  Substance Use Topics  . Smoking status: Never Smoker   . Smokeless tobacco: Not on file  . Alcohol Use: No    OB History    Grav Para Term Preterm Abortions TAB SAB Ect Mult Living                  Review of Systems  Psychiatric/Behavioral: Positive for altered mental status.  All other systems  reviewed and are negative.    Allergies  Morphine and related  Home Medications   Current Outpatient Rx  Name Route Sig Dispense Refill  . DEXTROMETHORPHAN POLISTIREX ER 30 MG/5ML PO LQCR Oral Take 30 mg by mouth every 4 (four) hours as needed. For cough.    . DOCUSATE SODIUM 100 MG PO CAPS Oral Take 100 mg by mouth daily.    . FUROSEMIDE 40 MG PO TABS Oral Take 40 mg by mouth daily.    Marland Kitchen MEMANTINE HCL 10 MG PO TABS Oral Take 10 mg by mouth daily. Taken in AM.    . MEMANTINE HCL 5 MG PO TABS Oral Take 5 mg by mouth daily. Taken in PM.    . METOPROLOL SUCCINATE ER 100 MG PO TB24 Oral Take 100 mg by mouth daily. Take with or immediately following a meal.    . OXYCODONE-ACETAMINOPHEN 5-325 MG PO TABS Oral Take 1 tablet by mouth every 6 (six) hours as needed. For pain    . POLYETHYLENE GLYCOL 3350 PO PACK Oral Take 17 g by mouth daily.    Marland Kitchen POTASSIUM CHLORIDE CRYS ER 10 MEQ PO TBCR Oral Take 10 mEq by mouth daily.     . WARFARIN SODIUM 4 MG PO TABS Oral Take 4 mg by mouth See  admin instructions. Taken on Sundays, Tuesdays, Thursdays and Saturdays.    . WARFARIN SODIUM 6 MG PO TABS Oral Take 6 mg by mouth See admin instructions. Taken on Mondays, Wednesdays and Fridays.      BP 95/43  Pulse 70  Temp(Src) 98.6 F (37 C) (Oral)  Resp 18  SpO2 96%  Physical Exam  Nursing note and vitals reviewed. Constitutional: She appears well-developed and well-nourished.  Non-toxic appearance. No distress.  HENT:  Head: Normocephalic and atraumatic.  Eyes: Conjunctivae, EOM and lids are normal. Pupils are equal, round, and reactive to light.  Neck: Normal range of motion. Neck supple. No tracheal deviation present. No mass present.  Cardiovascular: Normal rate, regular rhythm and normal heart sounds.  Exam reveals no gallop.   No murmur heard. Pulmonary/Chest: Effort normal and breath sounds normal. No stridor. No respiratory distress. She has no decreased breath sounds. She has no wheezes. She  has no rhonchi. She has no rales.  Abdominal: Soft. Normal appearance and bowel sounds are normal. She exhibits no distension. There is no tenderness. There is no rebound and no CVA tenderness.  Musculoskeletal: Normal range of motion. She exhibits no edema and no tenderness.  Neurological: She is alert. She has normal strength. No cranial nerve deficit or sensory deficit.  Skin: Skin is warm and dry. No abrasion and no rash noted.  Psychiatric: Her affect is blunt. She is slowed.    ED Course  Procedures (including critical care time)   Labs Reviewed  PROTIME-INR  CBC  DIFFERENTIAL  COMPREHENSIVE METABOLIC PANEL  URINALYSIS, ROUTINE W REFLEX MICROSCOPIC  URINE CULTURE  PRO B NATRIURETIC PEPTIDE   No results found.   No diagnosis found.    MDM  Patient's operative note examined and one of the pacemaker wires was capped off. Patient is to receive Rocephin IV for her UTI and will likely be discharged home. Patient's elevated BNP noted and I'll give her an extra dose of IV Lasix here. Chest x-ray without signs of pulmonary edema. Lung exam without evidence of severe rhonchi or rales. Patient's thrombocytopenia is chronic        Toy Baker, MD 06/21/11 2004

## 2011-06-21 NOTE — ED Notes (Signed)
Pacemaker had to be "redone" on April 12th.

## 2011-06-24 LAB — URINE CULTURE
Colony Count: 95000
Culture  Setup Time: 201304202215

## 2011-06-25 NOTE — ED Notes (Signed)
+  Urine. Patient treated with Keflex. Sensitive to same. Per protocol MD. °

## 2011-06-30 ENCOUNTER — Encounter: Payer: Medicare Other | Admitting: Physician Assistant

## 2011-06-30 ENCOUNTER — Ambulatory Visit: Payer: Medicare Other

## 2011-07-04 ENCOUNTER — Emergency Department (HOSPITAL_COMMUNITY)
Admission: EM | Admit: 2011-07-04 | Discharge: 2011-07-04 | Disposition: A | Discharge status: Skilled Nursing Facility | Payer: Medicare Other | Attending: Emergency Medicine | Admitting: Emergency Medicine

## 2011-07-04 ENCOUNTER — Emergency Department (HOSPITAL_COMMUNITY): Payer: Medicare Other

## 2011-07-04 ENCOUNTER — Encounter (HOSPITAL_COMMUNITY): Payer: Self-pay | Admitting: *Deleted

## 2011-07-04 DIAGNOSIS — R5383 Other fatigue: Secondary | ICD-10-CM | POA: Insufficient documentation

## 2011-07-04 DIAGNOSIS — I4891 Unspecified atrial fibrillation: Secondary | ICD-10-CM | POA: Insufficient documentation

## 2011-07-04 DIAGNOSIS — R0600 Dyspnea, unspecified: Secondary | ICD-10-CM

## 2011-07-04 DIAGNOSIS — I10 Essential (primary) hypertension: Secondary | ICD-10-CM | POA: Insufficient documentation

## 2011-07-04 DIAGNOSIS — R0989 Other specified symptoms and signs involving the circulatory and respiratory systems: Secondary | ICD-10-CM | POA: Insufficient documentation

## 2011-07-04 DIAGNOSIS — R0609 Other forms of dyspnea: Secondary | ICD-10-CM | POA: Insufficient documentation

## 2011-07-04 DIAGNOSIS — Z7901 Long term (current) use of anticoagulants: Secondary | ICD-10-CM | POA: Insufficient documentation

## 2011-07-04 DIAGNOSIS — Z950 Presence of cardiac pacemaker: Secondary | ICD-10-CM | POA: Insufficient documentation

## 2011-07-04 DIAGNOSIS — M1990 Unspecified osteoarthritis, unspecified site: Secondary | ICD-10-CM | POA: Insufficient documentation

## 2011-07-04 DIAGNOSIS — R0602 Shortness of breath: Secondary | ICD-10-CM | POA: Insufficient documentation

## 2011-07-04 DIAGNOSIS — R5381 Other malaise: Secondary | ICD-10-CM | POA: Insufficient documentation

## 2011-07-04 DIAGNOSIS — Z79899 Other long term (current) drug therapy: Secondary | ICD-10-CM | POA: Insufficient documentation

## 2011-07-04 DIAGNOSIS — F0390 Unspecified dementia without behavioral disturbance: Secondary | ICD-10-CM | POA: Insufficient documentation

## 2011-07-04 LAB — URINALYSIS, ROUTINE W REFLEX MICROSCOPIC
Glucose, UA: NEGATIVE mg/dL
Hgb urine dipstick: NEGATIVE
Protein, ur: NEGATIVE mg/dL
Specific Gravity, Urine: 1.023 (ref 1.005–1.030)

## 2011-07-04 LAB — COMPREHENSIVE METABOLIC PANEL
Alkaline Phosphatase: 111 U/L (ref 39–117)
BUN: 22 mg/dL (ref 6–23)
Calcium: 8.8 mg/dL (ref 8.4–10.5)
GFR calc Af Amer: 69 mL/min — ABNORMAL LOW (ref >90)
Glucose, Bld: 103 mg/dL — ABNORMAL HIGH (ref 70–99)
Potassium: 3.5 mEq/L (ref 3.5–5.1)
Total Protein: 5.9 g/dL — ABNORMAL LOW (ref 6.0–8.3)

## 2011-07-04 LAB — CBC
HCT: 33.2 % — ABNORMAL LOW (ref 36.0–46.0)
Hemoglobin: 10.7 g/dL — ABNORMAL LOW (ref 12.0–15.0)
MCH: 28.8 pg (ref 26.0–34.0)
MCV: 89.2 fL (ref 78.0–100.0)
RBC: 3.72 MIL/uL — ABNORMAL LOW (ref 3.87–5.11)

## 2011-07-04 LAB — D-DIMER, QUANTITATIVE: D-Dimer, Quant: 1.77 ug/mL-FEU — ABNORMAL HIGH (ref 0.00–0.48)

## 2011-07-04 LAB — DIFFERENTIAL
Eosinophils Absolute: 0.2 10*3/uL (ref 0.0–0.7)
Eosinophils Relative: 2 % (ref 0–5)
Lymphs Abs: 0.7 10*3/uL (ref 0.7–4.0)
Monocytes Absolute: 0.6 10*3/uL (ref 0.1–1.0)
Monocytes Relative: 10 % (ref 3–12)

## 2011-07-04 LAB — POCT I-STAT TROPONIN I: Troponin i, poc: 0.06 ng/mL (ref 0.00–0.08)

## 2011-07-04 LAB — PRO B NATRIURETIC PEPTIDE: Pro B Natriuretic peptide (BNP): 15315 pg/mL — ABNORMAL HIGH (ref 0–450)

## 2011-07-04 LAB — PROTIME-INR
INR: 4.78 — ABNORMAL HIGH (ref 0.00–1.49)
Prothrombin Time: 45.5 seconds — ABNORMAL HIGH (ref 11.6–15.2)

## 2011-07-04 MED ORDER — SODIUM CHLORIDE 0.9 % IV SOLN
Freq: Once | INTRAVENOUS | Status: AC
  Administered 2011-07-04: 18:00:00 via INTRAVENOUS

## 2011-07-04 MED ORDER — FUROSEMIDE 10 MG/ML IJ SOLN: 80.0000 mg | Freq: Once | INTRAMUSCULAR | Status: DC

## 2011-07-04 MED ORDER — IOHEXOL 300 MG/ML  SOLN: 100.0000 mL | Freq: Once | INTRAMUSCULAR | Status: DC | PRN

## 2011-07-04 MED ORDER — FUROSEMIDE 10 MG/ML IJ SOLN
INTRAMUSCULAR | Status: AC
  Filled 2011-07-04: qty 8

## 2011-07-04 NOTE — ED Notes (Signed)
Patient transported to CT 

## 2011-07-04 NOTE — ED Notes (Signed)
PT[facility] notified for pt tranport to [facility].

## 2011-07-04 NOTE — ED Provider Notes (Signed)
History     CSN: [id number]  Arrival date & time 07/04/11  1653   First MD Initiated Contact with Patient 07/04/11 1708      Chief Complaint  Patient presents with  . Weakness    (Consider location/radiation/quality/duration/timing/severity/associated sxs/prior treatment) Patient is a 78 y.o. female presenting with weakness. The history is provided by the patient and a relative.  Weakness  Additional symptoms include weakness.   patient here with an episode of becoming short of breath at the nursing home which has since resolved. Patient does have a history of dementia so it is unclear if she had chest pain or not. According to the family members who saw her yesterday patient is currently at her baseline. No recent fever or cough. No vomiting or diarrhea. Today symptoms lasted approximately 10-15 minutes. No lower extremity edema or pain. Nothing made her symptoms better or worse and no treatment was done prior to arrival  Past Medical History  Diagnosis Date  . Permanent atrial fibrillation   . Dementia     mild  . Bradycardia     s/p PPM  . DJD (degenerative joint disease)     ambulates by wheelchair  . Diastolic dysfunction   . Venous stasis ulcer     L leg, slow healing  . Obesity   . Hypertension   . Renal disorder     Past Surgical History  Procedure Date  . Pacemaker insertion 04/11/04    MDT implanted by [name]  . Mitral valve replacement 04/03/04    Mitral valve replacement with a 24 mm mechanical ATS valve.  . Cholecystectomy   . Appendectomy   . Laminectomy   . Abdominal hysterectomy     History reviewed. No pertinent family history.  History  Substance Use Topics  . Smoking status: Never Smoker   . Smokeless tobacco: Not on file  . Alcohol Use: No    OB History    Grav Para Term Preterm Abortions TAB SAB Ect Mult Living                  Review of Systems  Neurological: Positive for weakness.  All other systems reviewed and are  negative.    Allergies  Morphine and related  Home Medications   Current Outpatient Rx  Name Route Sig Dispense Refill  . DEXTROMETHORPHAN POLISTIREX ER 30 MG/5ML PO LQCR Oral Take 30 mg by mouth every 4 (four) hours as needed. For cough.    . DOCUSATE SODIUM 100 MG PO CAPS Oral Take 100 mg by mouth daily.    . FUROSEMIDE 40 MG PO TABS Oral Take 40 mg by mouth daily.    Marland Kitchen MEMANTINE HCL 10 MG PO TABS Oral Take 10 mg by mouth daily. Taken in AM.    . MEMANTINE HCL 5 MG PO TABS Oral Take 5 mg by mouth daily. Taken in PM.    . METOPROLOL SUCCINATE ER 100 MG PO TB24 Oral Take 100 mg by mouth daily. Take with or immediately following a meal.    . OXYCODONE-ACETAMINOPHEN 5-325 MG PO TABS Oral Take 1 tablet by mouth every 6 (six) hours as needed. For pain    . POLYETHYLENE GLYCOL 3350 PO PACK Oral Take 17 g by mouth daily.    Marland Kitchen POTASSIUM CHLORIDE CRYS ER 10 MEQ PO TBCR Oral Take 10 mEq by mouth daily.     . WARFARIN SODIUM 4 MG PO TABS Oral Take 4 mg by mouth See admin instructions.  Taken on Sundays, Tuesdays, Thursdays and Saturdays.    . WARFARIN SODIUM 6 MG PO TABS Oral Take 6 mg by mouth See admin instructions. Taken on Mondays, Wednesdays and Fridays.      BP 110/50  Pulse 79  Temp(Src) 98.4 F (36.9 C) (Oral)  Resp 14  SpO2 99%  Physical Exam  Nursing note and vitals reviewed. Constitutional: She is oriented to person, place, and time. Vital signs are normal. She appears well-developed and well-nourished.  Non-toxic appearance. No distress.  HENT:  Head: Normocephalic and atraumatic.  Eyes: Conjunctivae, EOM and lids are normal. Pupils are equal, round, and reactive to light.  Neck: Normal range of motion. Neck supple. No tracheal deviation present. No mass present.  Cardiovascular: Normal rate, regular rhythm and normal heart sounds.  Exam reveals no gallop.   No murmur heard. Pulmonary/Chest: Effort normal and breath sounds normal. No stridor. No respiratory distress. She has  no decreased breath sounds. She has no wheezes. She has no rhonchi. She has no rales.  Abdominal: Soft. Normal appearance and bowel sounds are normal. She exhibits no distension. There is no tenderness. There is no rebound and no CVA tenderness.  Musculoskeletal: Normal range of motion. She exhibits no edema and no tenderness.  Neurological: She is alert and oriented to person, place, and time. She has normal strength. No cranial nerve deficit or sensory deficit. GCS eye subscore is 4. GCS verbal subscore is 5. GCS motor subscore is 6.  Skin: Skin is warm and dry. No abrasion and no rash noted.  Psychiatric: Her speech is normal. Her affect is blunt. She is not agitated.    ED Course  Procedures (including critical care time)  Labs Reviewed - No data to display No results found.   No diagnosis found.    MDM   Date: 07/04/2011  Rate: 76  Rhythm: paced  QRS Axis: normal  Intervals: paced  ST/T Wave abnormalities: paced  Conduction Disutrbances:paced  Narrative Interpretation:   Old EKG Reviewed: unchanged  9:38 PM Patient given Lasix 80 mg IV push. No signs of PE on chest CT. No severe signs of CHF, but her BNP was elevated. She has no signs of dyspnea at this time. She is stable for discharge for home        Toy Baker, MD 07/04/11 2138

## 2011-07-04 NOTE — ED Notes (Signed)
Patient remains on monitor and is resting with NAD at this time. Patient talking with her daughters.

## 2011-07-04 NOTE — ED Notes (Signed)
Per EMS- patient from [facility].  Patient's daughter visited patient yesterday and noticed her mother looked pale and was not eating. Patient states she was not hungry. BP 130/76 HR 80 CBG 112. Pacemaker, heart valve repair. AFib, HTN, Renal failure Memory Loss/TIA's in past.

## 2011-07-04 NOTE — ED Notes (Signed)
Patient states she is not sure why she is here other than her daughter was concerned she is not eating and called the nursing home and ask they have her brought to the hospital. Patient states she eats but has not had an appotite the last few days. Patient denies any pain or weakness and states she no longer walks and spends most of her days in bed. Patient is A&O x 3 and is resting with NAD at this time. Patient placed on monitor and family is at the bedside.

## 2011-07-04 NOTE — ED Notes (Signed)
PTAR notified for pt tranport to [facility]. 

## 2011-07-04 NOTE — ED Notes (Signed)
Patient transported to X-ray 

## 2011-07-04 NOTE — ED Notes (Signed)
PTAR arrived for pt transport 

## 2011-07-05 LAB — URINE CULTURE: Colony Count: 85000

## 2011-07-09 ENCOUNTER — Ambulatory Visit (INDEPENDENT_AMBULATORY_CARE_PROVIDER_SITE_OTHER): Payer: Medicare Other | Admitting: *Deleted

## 2011-07-09 ENCOUNTER — Encounter: Payer: Self-pay | Admitting: Internal Medicine

## 2011-07-09 ENCOUNTER — Encounter: Payer: Medicare Other | Admitting: Physician Assistant

## 2011-07-09 DIAGNOSIS — R001 Bradycardia, unspecified: Secondary | ICD-10-CM

## 2011-07-09 DIAGNOSIS — I498 Other specified cardiac arrhythmias: Secondary | ICD-10-CM

## 2011-07-09 LAB — PACEMAKER DEVICE OBSERVATION
BATTERY VOLTAGE: 2.79 V
BMOD-0005RV: 95 {beats}/min
RV LEAD AMPLITUDE: 15.68 mv
RV LEAD IMPEDENCE PM: 493 Ohm
RV LEAD THRESHOLD: 1 V

## 2011-07-09 NOTE — Progress Notes (Signed)
Wound check-PPM 

## 2011-08-19 ENCOUNTER — Encounter: Payer: Self-pay | Admitting: Internal Medicine

## 2011-08-29 ENCOUNTER — Inpatient Hospital Stay (HOSPITAL_COMMUNITY)
Admission: EM | Admit: 2011-08-29 | Discharge: 2011-09-04 | DRG: 378 | Disposition: A | Discharge status: Hospice | Attending: Internal Medicine | Admitting: Internal Medicine

## 2011-08-29 ENCOUNTER — Encounter (HOSPITAL_COMMUNITY): Payer: Self-pay

## 2011-08-29 DIAGNOSIS — K625 Hemorrhage of anus and rectum: Principal | ICD-10-CM

## 2011-08-29 DIAGNOSIS — D649 Anemia, unspecified: Secondary | ICD-10-CM

## 2011-08-29 DIAGNOSIS — Z950 Presence of cardiac pacemaker: Secondary | ICD-10-CM

## 2011-08-29 DIAGNOSIS — I4891 Unspecified atrial fibrillation: Secondary | ICD-10-CM

## 2011-08-29 DIAGNOSIS — Z515 Encounter for palliative care: Secondary | ICD-10-CM

## 2011-08-29 DIAGNOSIS — D62 Acute posthemorrhagic anemia: Secondary | ICD-10-CM

## 2011-08-29 DIAGNOSIS — I10 Essential (primary) hypertension: Secondary | ICD-10-CM | POA: Diagnosis present

## 2011-08-29 DIAGNOSIS — Z79899 Other long term (current) drug therapy: Secondary | ICD-10-CM

## 2011-08-29 DIAGNOSIS — F068 Other specified mental disorders due to known physiological condition: Secondary | ICD-10-CM | POA: Diagnosis present

## 2011-08-29 DIAGNOSIS — N390 Urinary tract infection, site not specified: Secondary | ICD-10-CM

## 2011-08-29 DIAGNOSIS — R627 Adult failure to thrive: Secondary | ICD-10-CM

## 2011-08-29 DIAGNOSIS — M1990 Unspecified osteoarthritis, unspecified site: Secondary | ICD-10-CM | POA: Diagnosis present

## 2011-08-29 DIAGNOSIS — Z954 Presence of other heart-valve replacement: Secondary | ICD-10-CM

## 2011-08-29 DIAGNOSIS — N179 Acute kidney failure, unspecified: Secondary | ICD-10-CM

## 2011-08-29 DIAGNOSIS — R791 Abnormal coagulation profile: Secondary | ICD-10-CM

## 2011-08-29 DIAGNOSIS — F0390 Unspecified dementia without behavioral disturbance: Secondary | ICD-10-CM

## 2011-08-29 DIAGNOSIS — Z66 Do not resuscitate: Secondary | ICD-10-CM | POA: Diagnosis present

## 2011-08-29 DIAGNOSIS — K922 Gastrointestinal hemorrhage, unspecified: Secondary | ICD-10-CM

## 2011-08-29 DIAGNOSIS — E871 Hypo-osmolality and hyponatremia: Secondary | ICD-10-CM

## 2011-08-29 DIAGNOSIS — Z7901 Long term (current) use of anticoagulants: Secondary | ICD-10-CM

## 2011-08-29 DIAGNOSIS — Z952 Presence of prosthetic heart valve: Secondary | ICD-10-CM

## 2011-08-29 DIAGNOSIS — M2550 Pain in unspecified joint: Secondary | ICD-10-CM

## 2011-08-29 DIAGNOSIS — I4821 Permanent atrial fibrillation: Secondary | ICD-10-CM

## 2011-08-29 DIAGNOSIS — I2510 Atherosclerotic heart disease of native coronary artery without angina pectoris: Secondary | ICD-10-CM | POA: Diagnosis present

## 2011-08-29 HISTORY — DX: Constipation, unspecified: K59.00

## 2011-08-29 HISTORY — DX: Personal history of urinary calculi: Z87.442

## 2011-08-29 HISTORY — DX: Atherosclerotic heart disease of native coronary artery without angina pectoris: I25.10

## 2011-08-29 LAB — CBC
HCT: 27.9 % — ABNORMAL LOW (ref 36.0–46.0)
Hemoglobin: 9.1 g/dL — ABNORMAL LOW (ref 12.0–15.0)
MCH: 29.1 pg (ref 26.0–34.0)
MCHC: 32.6 g/dL (ref 30.0–36.0)
MCV: 89.1 fL (ref 78.0–100.0)
Platelets: 290 10*3/uL (ref 150–400)
RBC: 3.13 MIL/uL — ABNORMAL LOW (ref 3.87–5.11)
RDW: 15.4 % (ref 11.5–15.5)
WBC: 12 10*3/uL — ABNORMAL HIGH (ref 4.0–10.5)

## 2011-08-29 LAB — PROTIME-INR
INR: >10 (ref 0.00–1.49)
Prothrombin Time: >90 seconds — ABNORMAL HIGH (ref 11.6–15.2)
Prothrombin Time: >90 seconds — ABNORMAL HIGH (ref 11.6–15.2)

## 2011-08-29 LAB — COMPREHENSIVE METABOLIC PANEL
ALT: 7 U/L (ref 0–35)
AST: 15 U/L (ref 0–37)
Albumin: 2.9 g/dL — ABNORMAL LOW (ref 3.5–5.2)
Alkaline Phosphatase: 86 U/L (ref 39–117)
BUN: 54 mg/dL — ABNORMAL HIGH (ref 6–23)
CO2: 26 mEq/L (ref 19–32)
Calcium: 8.9 mg/dL (ref 8.4–10.5)
Chloride: 103 mEq/L (ref 96–112)
Creatinine, Ser: 1.44 mg/dL — ABNORMAL HIGH (ref 0.50–1.10)
GFR calc Af Amer: 38 mL/min — ABNORMAL LOW (ref >90)
GFR calc non Af Amer: 33 mL/min — ABNORMAL LOW (ref >90)
Glucose, Bld: 145 mg/dL — ABNORMAL HIGH (ref 70–99)
Potassium: 4.2 mEq/L (ref 3.5–5.1)
Sodium: 141 mEq/L (ref 135–145)
Total Bilirubin: 0.6 mg/dL (ref 0.3–1.2)
Total Protein: 6.5 g/dL (ref 6.0–8.3)

## 2011-08-29 LAB — ABO/RH: ABO/RH(D): A POS

## 2011-08-29 MED ORDER — CIPROFLOXACIN HCL 250 MG PO TABS
250.0000 mg | ORAL_TABLET | Freq: Two times a day (BID) | ORAL | Status: DC
  Administered 2011-08-29 – 2011-09-03 (×8): 250 mg via ORAL
  Filled 2011-08-29 (×14): qty 1

## 2011-08-29 MED ORDER — OXYCODONE HCL 10 MG PO TB12
10.0000 mg | ORAL_TABLET | Freq: Two times a day (BID) | ORAL | Status: DC
  Administered 2011-08-30: 10 mg via ORAL
  Filled 2011-08-29: qty 1

## 2011-08-29 MED ORDER — METOPROLOL SUCCINATE ER 100 MG PO TB24
100.0000 mg | ORAL_TABLET | Freq: Every day | ORAL | Status: DC
  Administered 2011-09-01 – 2011-09-03 (×3): 100 mg via ORAL
  Filled 2011-08-29 (×7): qty 1

## 2011-08-29 MED ORDER — OXYCODONE-ACETAMINOPHEN 5-325 MG PO TABS
0.5000 | ORAL_TABLET | ORAL | Status: DC | PRN
  Administered 2011-08-31 – 2011-09-02 (×3): 1 via ORAL
  Administered 2011-09-03 – 2011-09-04 (×2): 0.5 via ORAL
  Filled 2011-08-29 (×5): qty 1

## 2011-08-29 MED ORDER — POLYETHYLENE GLYCOL 3350 17 G PO PACK
17.0000 g | PACK | Freq: Every day | ORAL | Status: DC
  Administered 2011-09-01 – 2011-09-03 (×2): 17 g via ORAL
  Filled 2011-08-29 (×7): qty 1

## 2011-08-29 MED ORDER — SODIUM CHLORIDE 0.9 % IV SOLN
Freq: Once | INTRAVENOUS | Status: AC
  Administered 2011-08-29: 17:00:00 via INTRAVENOUS

## 2011-08-29 MED ORDER — ONDANSETRON HCL 4 MG/2ML IJ SOLN: 4.0000 mg | Freq: Three times a day (TID) | INTRAMUSCULAR | Status: DC | PRN

## 2011-08-29 MED ORDER — ONDANSETRON HCL 4 MG PO TABS: 4.0000 mg | ORAL_TABLET | Freq: Four times a day (QID) | ORAL | Status: DC | PRN

## 2011-08-29 MED ORDER — SODIUM CHLORIDE 0.9 % IV SOLN: INTRAVENOUS | Status: DC

## 2011-08-29 MED ORDER — DOCUSATE SODIUM 100 MG PO CAPS
100.0000 mg | ORAL_CAPSULE | Freq: Every day | ORAL | Status: DC
  Administered 2011-09-01 – 2011-09-03 (×2): 100 mg via ORAL
  Filled 2011-08-29 (×7): qty 1

## 2011-08-29 MED ORDER — ONDANSETRON HCL 4 MG/2ML IJ SOLN: 4.0000 mg | Freq: Four times a day (QID) | INTRAMUSCULAR | Status: DC | PRN

## 2011-08-29 MED ORDER — POTASSIUM CHLORIDE CRYS ER 10 MEQ PO TBCR
10.0000 meq | EXTENDED_RELEASE_TABLET | Freq: Every day | ORAL | Status: DC
  Administered 2011-09-01: 10 meq via ORAL
  Filled 2011-08-29 (×4): qty 1

## 2011-08-29 MED ORDER — PHYTONADIONE 5 MG PO TABS
10.0000 mg | ORAL_TABLET | Freq: Once | ORAL | Status: AC
  Administered 2011-08-29: 10 mg via ORAL
  Filled 2011-08-29: qty 2

## 2011-08-29 MED ORDER — SODIUM CHLORIDE 0.9 % IV SOLN
INTRAVENOUS | Status: DC
  Administered 2011-08-30: 06:00:00 via INTRAVENOUS

## 2011-08-29 NOTE — ED Provider Notes (Addendum)
History    85yF with with rectal bleeding and bloody stool today. In nursing home. Daughter's visiting pt today and noticed peculiar smell. When checked pt had a large amount of black stool. "It just looked like chocolate syrup." Passing maroon stool after that. Symptoms started today as far as they are aware. Also noticed Hematoma to RUE. Pt on coumadin for hx of valvular afib. Per daughter, apparently told that coumadin was goining to be stopped about a week ago but they're not sure if actually has been or not. Pt with severe dementia, is on hospice and bed bound. No hx of GI bleed that they are aware of. No vomiting. Pt's mental status waxes and wanes. Do not feel that she is acutely altered currently. Pt is [name]/DNI. They are ok with hospitalization and blood products as needed but do not want to pursuit any invasive testing such as endoscopy. Chronic foley. Currently on cipro for UTI.   CSN: [id number]  Arrival date & time 08/29/11  1330   First MD Initiated Contact with Patient 08/29/11 1511      Chief Complaint  Patient presents with  . Rectal Bleeding  . Weakness    (Consider location/radiation/quality/duration/timing/severity/associated sxs/prior treatment) HPI  Past Medical History  Diagnosis Date  . Permanent atrial fibrillation   . Dementia     mild  . Bradycardia     s/p PPM  . DJD (degenerative joint disease)     ambulates by wheelchair  . Diastolic dysfunction   . Venous stasis ulcer     L leg, slow healing  . Obesity   . Hypertension   . Renal disorder     Past Surgical History  Procedure Date  . Pacemaker insertion 04/11/04    MDT implanted by [name]  . Mitral valve replacement 04/03/04    Mitral valve replacement with a 24 mm mechanical ATS valve.  . Cholecystectomy   . Appendectomy   . Laminectomy   . Abdominal hysterectomy     No family history on file.  History  Substance Use Topics  . Smoking status: Never Smoker   . Smokeless tobacco: Not on  file  . Alcohol Use: No    OB History    Grav Para Term Preterm Abortions TAB SAB Ect Mult Living                  Review of Systems   Review of symptoms negative unless otherwise noted in HPI.   Allergies  Morphine and related  Home Medications   Current Outpatient Rx  Name Route Sig Dispense Refill  . CIPROFLOXACIN HCL 250 MG PO TABS Oral Take 250 mg by mouth 2 (two) times daily. 7 day course of therapy; started 08/23/11    . DEXTROMETHORPHAN POLISTIREX ER 30 MG/5ML PO LQCR Oral Take 30 mg by mouth every 4 (four) hours as needed. For cough.    . DOCUSATE SODIUM 100 MG PO CAPS Oral Take 100 mg by mouth daily.    . FUROSEMIDE 40 MG PO TABS Oral Take 40 mg by mouth daily.    Marland Kitchen LORAZEPAM 0.5 MG PO TABS Oral Take 0.5 mg by mouth every 8 (eight) hours as needed. For anxiety.    . MELOXICAM 7.5 MG PO TABS Oral Take 7.5 mg by mouth daily as needed. For pain.    Marland Kitchen METOPROLOL SUCCINATE ER 100 MG PO TB24 Oral Take 100 mg by mouth daily. Take with or immediately following a meal.    .  OXYCODONE HCL ER 10 MG PO TB12 Oral Take 10 mg by mouth every 12 (twelve) hours.    . OXYCODONE-ACETAMINOPHEN 5-325 MG PO TABS Oral Take 0.25-1 tablets by mouth every 4 (four) hours as needed. For pain.    Marland Kitchen POLYETHYLENE GLYCOL 3350 PO PACK Oral Take 17 g by mouth daily. For constipation.    Marland Kitchen POTASSIUM CHLORIDE CRYS ER 10 MEQ PO TBCR Oral Take 10 mEq by mouth daily.     . WARFARIN SODIUM 4 MG PO TABS Oral Take 4 mg by mouth daily.       BP 108/27  Pulse 92  Temp 98.4 F (36.9 C) (Oral)  Resp 22  SpO2 98%  Physical Exam  Nursing note and vitals reviewed. Constitutional: She appears well-nourished. No distress.  HENT:  Head: Normocephalic and atraumatic.  Eyes: Conjunctivae are normal. Pupils are equal, round, and reactive to light. Right eye exhibits no discharge. Left eye exhibits no discharge.  Neck: Neck supple.  Cardiovascular: Normal rate, regular rhythm and normal heart sounds.  Exam reveals  no gallop and no friction rub.   No murmur heard. Pulmonary/Chest: Effort normal and breath sounds normal. No respiratory distress.  Abdominal: Soft. She exhibits no distension. There is tenderness.       Surgical scar RUQ. Abdomen seems to be diffusely tender. Pt responds similarly though with palpation of almost anywhere on body though.No distension.  Genitourinary:       Foley with dark colored urine  Musculoskeletal: She exhibits no edema and no tenderness.       hematoma to posterior aspect RUE.  Neurological:       Laying in bed with eye closed. Follows basic commands. Responds to most "yes/no" questions appropriately but clearly demented. Pt appears to be at her baseline as reported by daughters and without acute focal findings.  Skin: Skin is warm and dry. She is not diaphoretic.  Psychiatric: She has a normal mood and affect. Her behavior is normal. Thought content normal.    ED Course  Procedures (including critical care time)  CRITICAL CARE Performed by: Raeford Razor   Total critical care time: 30 minutes  Critical care time was exclusive of separately billable procedures and treating other patients.  Critical care was necessary to treat or prevent imminent or life-threatening deterioration.  Critical care was time spent personally by me on the following activities: development of treatment plan with patient and/or surrogate as well as nursing, discussions with consultants, evaluation of patient's response to treatment, examination of patient, obtaining history from patient or surrogate, ordering and performing treatments and interventions, ordering and review of laboratory studies, ordering and review of radiographic studies, pulse oximetry and re-evaluation of patient's condition.  Labs Reviewed  PROTIME-INR - Abnormal; Notable for the following:    Prothrombin Time >90.0 (*)     INR >10.00 (*)     All other components within normal limits  CBC - Abnormal; Notable  for the following:    WBC 12.0 (*)     RBC 3.13 (*)     Hemoglobin 9.1 (*)     HCT 27.9 (*)     All other components within normal limits  COMPREHENSIVE METABOLIC PANEL - Abnormal; Notable for the following:    Glucose, Bld 145 (*)     BUN 54 (*)     Creatinine, Ser 1.44 (*)     Albumin 2.9 (*)     GFR calc non Af Amer 33 (*)     GFR  calc Af Amer 38 (*)     All other components within normal limits  OCCULT BLOOD, POC DEVICE  TYPE AND SCREEN  PREPARE FRESH FROZEN PLASMA  ABO/RH  PROTIME-INR   No results found.   1. Acute blood loss anemia   2. GI bleed   3. Supratherapeutic INR       MDM  85yF with GI bleed and supratherapeutic INR. On hospice and family does not want to pursuit invasive procedures but would like pt transfused PRN. Will give vitamin k and transfuse FFP. Hemoglobin 9.1. Will defer from PRBCs at this time but will continue to closely monitor.  Elevated Bun/Cr. Likely pre-renal with GI bleed contributing to elevated BUN as well. Gentle hydration at this time as will get additional IVf with transfusions. Admit.        Raeford Razor, MD 08/29/11 1544  Raeford Razor, MD 09/01/11 [phone]

## 2011-08-29 NOTE — ED Notes (Signed)
Attempted to call report to nurse on [facility]

## 2011-08-29 NOTE — ED Notes (Signed)
Bed:WA02<BR> Expected date:<BR> Expected time:<BR> Means of arrival:<BR> Comments:<BR> ems

## 2011-08-29 NOTE — ED Notes (Signed)
Report called to Palliative care RN, family at bedside, transferred to 34

## 2011-08-29 NOTE — ED Notes (Signed)
Critical PT of > 90 and critical INR >10 received from lab [facility].  MD notified.

## 2011-08-29 NOTE — H&P (Signed)
Hospital Admission Note Date: 08/29/2011  Patient name: Sherry Howell           Medical record number: [id number] Date of birth: 10-17-1925           Age: 78 y.o.   Gender: female    PCP:   Benita Stabile, MD   Chief Complaint:  Bright red blood per rectum  HPI: Sherry Howell is a 78 y.o. female with past medical history of permanent atrial fibrillation and mechanical mitral valve for which she is on chronic anticoagulation. Patient has severe dementia, bedbound and nursing home resident. Most of the history was obtained from the ED physician notes, as the patient was not able to tell meaningful history, I tried to call her daughter Dois Davenport, but there is no answer. Apparently patient was brought to the emergency department because of melanotic stools, followed by maroon-colored bowel movement. Patient was started recently on ciprofloxacin for UTI, and the daughter was told before the Coumadin should be stopped about a week ago and she did not know if it's stopped or not. Upon initial evaluation in the emergency department, patient was found to be slightly anemic lower baseline, her INR is more than 10.. The physician notes patient is [name]/DNI, family is okay with hospitalization of blood products as needed but they do not want to pursue any invasive testing such as colonoscopy.  Past Medical History: Past Medical History  Diagnosis Date  . Permanent atrial fibrillation   . Dementia     mild  . Bradycardia     s/p PPM  . DJD (degenerative joint disease)     ambulates by wheelchair  . Diastolic dysfunction   . Venous stasis ulcer     L leg, slow healing  . Hypertension   . Renal disorder   . Coronary artery disease   . Constipation   . History of kidney stones    Past Surgical History  Procedure Date  . Pacemaker insertion 04/11/04    MDT implanted by [name]  . Mitral valve replacement 04/03/04    Mitral valve replacement with a 24 mm mechanical ATS valve.  .  Cholecystectomy   . Appendectomy   . Laminectomy   . Abdominal hysterectomy     Medications: Prior to Admission medications   Medication Sig Start Date End Date Taking? Authorizing Provider  ciprofloxacin (CIPRO) 250 MG tablet Take 250 mg by mouth 2 (two) times daily. 7 day course of therapy; started 08/23/11   Yes Historical Provider, MD  dextromethorphan (DELSYM) 30 MG/5ML liquid Take 30 mg by mouth every 4 (four) hours as needed. For cough.   Yes Historical Provider, MD  docusate sodium (COLACE) 100 MG capsule Take 100 mg by mouth daily.   Yes Historical Provider, MD  furosemide (LASIX) 40 MG tablet Take 40 mg by mouth daily.   Yes Historical Provider, MD  LORazepam (ATIVAN) 0.5 MG tablet Take 0.5 mg by mouth every 8 (eight) hours as needed. For anxiety.   Yes Historical Provider, MD  meloxicam (MOBIC) 7.5 MG tablet Take 7.5 mg by mouth daily as needed. For pain.   Yes Historical Provider, MD  metoprolol succinate (TOPROL-XL) 100 MG 24 hr tablet Take 100 mg by mouth daily. Take with or immediately following a meal.   Yes Historical Provider, MD  oxyCODONE (OXYCONTIN) 10 MG 12 hr tablet Take 10 mg by mouth every 12 (twelve) hours.   Yes Historical Provider, MD  oxyCODONE-acetaminophen (PERCOCET) 5-325 MG per tablet Take  0.25-1 tablets by mouth every 4 (four) hours as needed. For pain.   Yes Historical Provider, MD  polyethylene glycol (MIRALAX / GLYCOLAX) packet Take 17 g by mouth daily. For constipation.   Yes Historical Provider, MD  potassium chloride (K-DUR,KLOR-CON) 10 MEQ tablet Take 10 mEq by mouth daily.    Yes Historical Provider, MD  warfarin (COUMADIN) 4 MG tablet Take 4 mg by mouth daily.     Historical Provider, MD    Allergies:   Allergies  Allergen Reactions  . Morphine And Related Itching and Rash    Social History:  reports that she has never smoked. She has never used smokeless tobacco. She reports that she does not drink alcohol or use illicit drugs.  Family  History: History reviewed. No pertinent family history.  Review of Systems:  Unobtainable secondary to patient dementia  Physical Exam: BP 124/66  Pulse 104  Temp 98.4 F (36.9 C) (Oral)  Resp 18  SpO2 98% General appearance: alert, disoriented no distress  Head: Normocephalic, without obvious abnormality, atraumatic  Eyes: conjunctivae/corneas clear. PERRL, EOM's intact. Fundi benign.  Nose: Nares normal. Septum midline. Mucosa normal. No drainage or sinus tenderness.  Throat: lips, mucosa, and tongue normal; teeth and gums normal  Neck: Supple, no masses, no cervical lymphadenopathy, no JVD appreciated, no meningeal signs Resp: clear to auscultation bilaterally  Chest wall: no tenderness  Cardio: regular rate and rhythm, S1, S2 normal, no murmur, click, rub or gallop  GI: soft, non-tender; bowel sounds normal; no masses, no organomegaly  Extremities: extremities normal, atraumatic, no cyanosis or edema  Skin: Skin color, texture, turgor normal. No rashes or lesions  Neurologic: Alert and oriented X 3, normal strength and tone. Normal symmetric reflexes. Normal coordination and gait  Labs on Admission:   Basename 08/29/11 1350  NA 141  K 4.2  CL 103  CO2 26  GLUCOSE 145*  BUN 54*  CREATININE 1.44*  CALCIUM 8.9  MG --  PHOS --    Basename 08/29/11 1350  AST 15  ALT 7  ALKPHOS 86  BILITOT 0.6  PROT 6.5  ALBUMIN 2.9*   No results found for this basename: LIPASE:2,AMYLASE:2 in the last 72 hours  Basename 08/29/11 1350  WBC 12.0*  NEUTROABS --  HGB 9.1*  HCT 27.9*  MCV 89.1  PLT 290   No results found for this basename: CKTOTAL:3,CKMB:3,CKMBINDEX:3,TROPONINI:3 in the last 72 hours No results found for this basename: TSH,T4TOTAL,FREET3,T3FREE,THYROIDAB in the last 72 hours No results found for this basename: VITAMINB12:2,FOLATE:2,FERRITIN:2,TIBC:2,IRON:2,RETICCTPCT:2 in the last 72 hours  Radiological Exams on Admission: No results  found.   IMPRESSION: Present on Admission:  .UTI .Dementia .Supratherapeutic INR .BRBPR  .Permanent atrial fibrillation  Assessment/Plan  1. BRBPR: As mentioned above patient daughter is okay with basic interventions, like transfusion of blood product, she does not want any invasive testing such as colonoscopy or endoscopy. Patient will be placed in the hospital, I will hold on: GI for now and try to normalize the INR.  2. Anemia: This is acute blood loss anemia on top of chronic anemia. Patient has baseline hemoglobin of about 10. I will check the hemoglobin daily and transfuse packed RBCs as needed, to keep hemoglobin more than 8.0 or transfuse if she is symptomatic.  3. Supratherapeutic INR: In the emergency department INR is more than 10, it could be secondary to the interaction between ciprofloxacin and Coumadin. Patient was given 10 mg of vitamin K, she is going to have 2 units of  FFP, I will repeat the INR now to make sure this was not lab error, and I'll repeat INR daily.  4. Acute renal failure: Patient baseline creatinine of 0.8 on 07/04/2011, she came in with creatinine of 1.4 for today. The BUN/creatinine ratio suggests this is prerenal azotemia, I will call her diuretics and give some IV fluids. I'll check her BMP in the morning. Patient seems to have mild to moderate CKD likely stage II or stage III  5. Permanent atrial fibrillation: Patient has symptomatic bradycardia and atrial fibrillation. Status post placement of permanent pacemaker. Patient follows with Dr. Johney Frame. Heart rate is controlled.  6. Mitral mechanical valve: Patient is on Coumadin her target supposed to be between 2.5-3.5.  Manveer Gomes A 08/29/2011, 5:01 PM

## 2011-08-29 NOTE — ED Notes (Signed)
Pt is a [name] from [facility] w/weakness, INR of 8, decreased appetite and bloody stools.  Bloody (burgendy) stools began today.  Sensitive to any touch, yells out in pain.  Came in w/a foley catheter.  Pt is also a hospice pt.

## 2011-08-30 DIAGNOSIS — K922 Gastrointestinal hemorrhage, unspecified: Secondary | ICD-10-CM

## 2011-08-30 DIAGNOSIS — R791 Abnormal coagulation profile: Secondary | ICD-10-CM

## 2011-08-30 DIAGNOSIS — Z954 Presence of other heart-valve replacement: Secondary | ICD-10-CM

## 2011-08-30 DIAGNOSIS — D649 Anemia, unspecified: Secondary | ICD-10-CM

## 2011-08-30 LAB — PREPARE FRESH FROZEN PLASMA

## 2011-08-30 LAB — URINALYSIS, ROUTINE W REFLEX MICROSCOPIC
Glucose, UA: NEGATIVE mg/dL
Ketones, ur: NEGATIVE mg/dL
Nitrite: NEGATIVE
Protein, ur: NEGATIVE mg/dL
Specific Gravity, Urine: 1.024 (ref 1.005–1.030)
Urobilinogen, UA: 1 mg/dL (ref 0.0–1.0)
pH: 5 (ref 5.0–8.0)

## 2011-08-30 LAB — PROTIME-INR
INR: 1.77 — ABNORMAL HIGH (ref 0.00–1.49)
INR: 1.6 — ABNORMAL HIGH (ref 0.00–1.49)
Prothrombin Time: 20.9 seconds — ABNORMAL HIGH (ref 11.6–15.2)
Prothrombin Time: 19.3 seconds — ABNORMAL HIGH (ref 11.6–15.2)

## 2011-08-30 LAB — URINE MICROSCOPIC-ADD ON

## 2011-08-30 MED ORDER — WARFARIN SODIUM 2 MG PO TABS
2.0000 mg | ORAL_TABLET | Freq: Once | ORAL | Status: AC
  Administered 2011-08-30: 2 mg via ORAL
  Filled 2011-08-30: qty 1

## 2011-08-30 MED ORDER — WARFARIN - PHARMACIST DOSING INPATIENT: Freq: Every day | Status: DC

## 2011-08-30 NOTE — Progress Notes (Signed)
[name].  Uncertain if this admission is related at this time.  Patient sleeping at this time.  No family present at bedside.  Spoke with Elnita Maxwell, RN and chart reviewed.  Please call [facility] [phone] with any questions or concerns.

## 2011-08-30 NOTE — Progress Notes (Signed)
ANTICOAGULATION CONSULT NOTE - Initial Consult  Pharmacy Consult for:  Coumadin Indication:  Atrial fibrillation and mechanical mitral valve  Allergies  Allergen Reactions  . Morphine And Related Itching and Rash   Patient Measurements: Height: 5\' 2"  (157.5 cm) Weight: 153 lb 9.6 oz (69.673 kg) IBW/kg (Calculated) : 50.1   Labs:  Basename 08/30/11 0905 08/30/11 0405 08/29/11 1640 08/29/11 1350  HGB -- -- -- 9.1*  HCT -- -- -- 27.9*  PLT -- -- -- 290  APTT -- -- -- --  LABPROT 19.3* 20.9* >90.0* --  INR 1.60* 1.77* >10.00* --  HEPARINUNFRC -- -- -- --  CREATININE -- -- -- 1.44*  CKTOTAL -- -- -- --  CKMB -- -- -- --  TROPONINI -- -- -- --    Estimated Creatinine Clearance: 27.1 ml/min (by C-G formula based on Cr of 1.44).   Medical History: Past Medical History  Diagnosis Date  . Permanent atrial fibrillation   . Dementia     mild  . Bradycardia     s/p PPM  . DJD (degenerative joint disease)     ambulates by wheelchair  . Diastolic dysfunction   . Venous stasis ulcer     L leg, slow healing  . Hypertension   . Renal disorder   . Coronary artery disease   . Constipation   . History of kidney stones    Medications:  Scheduled:    . ciprofloxacin  250 mg Oral BID  . docusate sodium  100 mg Oral Daily  . metoprolol succinate  100 mg Oral Daily  . oxyCODONE  10 mg Oral Q12H  . polyethylene glycol  17 g Oral Daily  . potassium chloride  10 mEq Oral Daily  . warfarin  2 mg Oral Once  . Warfarin - Pharmacist Dosing Inpatient   Does not apply q1800   Assessment: Asked to assist with Coumadin therapy for this 78 year-old female with atrial fibrillation and mechanical mitral valve.  She was admitted on 6/28 with a supratherapeutic INR above 10 and GI bleeding.  After receiving FFP and Vitamin K 10 mg po, the INR was reported as 1.6 this morning.  She is receiving Cipro, which may increase the anticoagulant effect of Coumadin in some patients.  Goal of Therapy:    INR 2.5-3.5 as indicated in Dr. Harriet Pho note of 08/29/11   Plan:   Coumadin 2 mg dose tonight  Follow daily PT/INR  Polo Riley R.Ph. 08/30/2011,9:14 PM

## 2011-08-31 DIAGNOSIS — K922 Gastrointestinal hemorrhage, unspecified: Secondary | ICD-10-CM

## 2011-08-31 DIAGNOSIS — D649 Anemia, unspecified: Secondary | ICD-10-CM

## 2011-08-31 DIAGNOSIS — Z954 Presence of other heart-valve replacement: Secondary | ICD-10-CM

## 2011-08-31 DIAGNOSIS — R791 Abnormal coagulation profile: Secondary | ICD-10-CM

## 2011-08-31 LAB — CBC
HCT: 20.2 % — ABNORMAL LOW (ref 36.0–46.0)
Hemoglobin: 6.3 g/dL — CL (ref 12.0–15.0)
MCH: 28.4 pg (ref 26.0–34.0)
MCV: 91 fL (ref 78.0–100.0)
Platelets: 215 10*3/uL (ref 150–400)
RBC: 2.22 MIL/uL — ABNORMAL LOW (ref 3.87–5.11)

## 2011-08-31 MED ORDER — DIPHENHYDRAMINE HCL 12.5 MG/5ML PO ELIX
25.0000 mg | ORAL_SOLUTION | Freq: Three times a day (TID) | ORAL | Status: DC | PRN
  Administered 2011-08-31 – 2011-09-02 (×4): 25 mg via ORAL
  Administered 2011-09-03 – 2011-09-04 (×3): 12.5 mg via ORAL
  Filled 2011-08-31: qty 5
  Filled 2011-08-31: qty 10
  Filled 2011-08-31 (×3): qty 5
  Filled 2011-08-31 (×2): qty 10
  Filled 2011-08-31: qty 5

## 2011-08-31 MED ORDER — ACETAMINOPHEN 325 MG PO TABS: 650.0000 mg | ORAL_TABLET | Freq: Four times a day (QID) | ORAL | Status: DC | PRN

## 2011-08-31 MED ORDER — WARFARIN SODIUM 4 MG PO TABS
4.0000 mg | ORAL_TABLET | Freq: Once | ORAL | Status: AC
  Administered 2011-08-31: 4 mg via ORAL
  Filled 2011-08-31: qty 1

## 2011-08-31 MED ORDER — HEPARIN (PORCINE) IN NACL 100-0.45 UNIT/ML-% IJ SOLN
800.0000 [IU]/h | INTRAMUSCULAR | Status: DC
  Filled 2011-08-31: qty 250

## 2011-08-31 NOTE — Progress Notes (Signed)
DAILY PROGRESS NOTE                              [facility]AL [facility]IC[facility]  SUBJECTIVE: Alert, a daughter at bedside. No evidence of overt bleeding.  OBJECTIVE: BP 132/71  Pulse 100  Temp 99.6 F (37.6 C) (Oral)  Resp 16  Ht 5\' 2"  (1.575 m)  Wt 69.673 kg (153 lb 9.6 oz)  BMI 28.09 kg/m2  SpO2 95%  Intake/Output Summary (Last 24 hours) at 08/31/11 1149 Last data filed at 08/31/11 0558  Gross per 24 hour  Intake    120 ml  Output    475 ml  Net   -355 ml                      Weight change:  Physical Exam: General: [name] not in any acute distress. HEENT: anicteric sclera, pupils equal reactive to light and accommodation CVS: S1-S2 heard, no murmur rubs or gallops Chest: clear to auscultation bilaterally, no wheezing rales or rhonchi Abdomen:  normal bowel sounds, soft, nontender, nondistended, no organomegaly Neuro: Cranial nerves II-XII intact, no focal neurological deficits Extremities: no cyanosis, no clubbing or edema noted bilaterally   Lab Results:  Basename 08/29/11 1350  NA 141  K 4.2  CL 103  CO2 26  GLUCOSE 145*  BUN 54*  CREATININE 1.44*  CALCIUM 8.9  MG --  PHOS --    Basename 08/29/11 1350  AST 15  ALT 7  ALKPHOS 86  BILITOT 0.6  PROT 6.5  ALBUMIN 2.9*   No results found for this basename: LIPASE:2,AMYLASE:2 in the last 72 hours  Basename 08/31/11 0830 08/29/11 1350  WBC 8.8 12.0*  NEUTROABS -- --  HGB 6.3* 9.1*  HCT 20.2* 27.9*  MCV 91.0 89.1  PLT 215 290   No results found for this basename: CKTOTAL:3,CKMB:3,CKMBINDEX:3,TROPONINI:3 in the last 72 hours No components found with this basename: POCBNP:3 No results found for this basename: DDIMER:2 in the last 72 hours No results found for this basename: HGBA1C:2 in the last 72 hours No results found for this basename: CHOL:2,HDL:2,LDLCALC:2,TRIG:2,CHOLHDL:2,LDLDIRECT:2 in the last 72 hours No results found for this basename: TSH,T4TOTAL,FREET3,T3FREE,THYROIDAB in the  last 72 hours No results found for this basename: VITAMINB12:2,FOLATE:2,FERRITIN:2,TIBC:2,IRON:2,RETICCTPCT:2 in the last 72 hours  Micro Results: Recent Results (from the past 240 hour(s))  URINE CULTURE     Status: Normal (Preliminary result)   Collection Time   08/30/11  6:00 AM      Component Value Range Status Comment   Specimen Description URINE, CATHETERIZED   Final    Special Requests NONE   Final    Culture  Setup Time 08/30/2011 11:11   Final    Colony Count PENDING   Incomplete    Culture Culture reincubated for better growth   Final    Report Status PENDING   Incomplete     Studies/Results: No results found. Medications: Scheduled Meds:   . ciprofloxacin  250 mg Oral BID  . docusate sodium  100 mg Oral Daily  . metoprolol succinate  100 mg Oral Daily  . oxyCODONE  10 mg Oral Q12H  . polyethylene glycol  17 g Oral Daily  . potassium chloride  10 mEq Oral Daily  . warfarin  2 mg Oral Once  . warfarin  4 mg Oral ONCE-1800  . Warfarin - Pharmacist Dosing Inpatient   Does not apply [phone]  Continuous Infusions:   . sodium chloride 75 mL/hr at 08/30/11 0603  . DISCONTD: heparin     PRN Meds:.ondansetron (ZOFRAN) IV, ondansetron, oxyCODONE-acetaminophen  ASSESSMENT & PLAN: Active Problems:  UTI  Dementia  Supratherapeutic INR  H/O mitral valve replacement with mechanical valve  Permanent atrial fibrillation  BRBPR   Acute renal failure   Rectal bleeding -This is likely secondary to supratherapeutic INR, seems like it's stopped. -Daughter is okay with transfusion of blood products, she does not want any invasive testing such as colonoscopy or endoscopy. -Will correct the INR and follow the hemoglobin.  Supratherapeutic INR -Patient is on chronic anticoagulation for mechanical mitral valve/atrial fibrillation. -Her INR reported to be more than 10 the time of admission, this is was repeated to verify the lab results. -Status post transfusion of 2 units of  FFP's and administration of vitamin K. -INR corrected to 1.9 the very next day, not sure if the initial INR of more than 10 is correct result.  Acute blood loss anemia -Patient does have chronic anemia, worsened by the acute blood loss now. Baseline hemoglobin about 10. -Hemoglobin today is 6.3, all transfuse 2 units of packed RBCs.  UTI -Patient was recently being treated for UTI with ciprofloxacin. -This is likely what caused the interaction with Coumadin and resulted in supratherapeutic INR.  Permanent atrial fibrillation -She has symptomatic bradycardia with the atrial fibrillation, status post placement of permanent pacemaker. -Her rate is controlled, patient supposed to be on Coumadin.  Mitral mechanical valve -INR today is 1.29, wanted to start patient on heparin drip because of the low INR. -The same time patient has concurrent GI bleed, hemoglobin of 6.3 down from 10. -Patient is an active hospice patient, with this difficult situation of the anticoagulation I will consult palliative medicine.   LOS: 2 days   Sherry Howell A 08/31/2011, 11:49 AM

## 2011-08-31 NOTE — Progress Notes (Signed)
Patient YN:[id number] Sherry Howell      DOB: 11-Oct-1925      MVH:[id number]  Consult to confirm GOC received.  Visited patient's room this afternoon no family at the bedside.  Will coordinate with patient's hospice team in the am.   Marivel Mcclarty L. Ladona Ridgel, MD MBA The Palliative Medicine Team at [facility] Phone: [phone] Pager: [phone]

## 2011-08-31 NOTE — Progress Notes (Signed)
[name].  Likely related admission.  Hospice dx is CHF with related dyspnea, afib, wt loss, pressure ulcer and anorexia.  Patient is in the bed awake.  Pleasant and cooperative.  Patient does not appear to be in any pain.  Spoke with Elnita Maxwell, RN and chart reviewed.  Please call [facility] [phone] with any questions or concerns.

## 2011-08-31 NOTE — Progress Notes (Addendum)
ANTICOAGULATION CONSULT NOTE - Initial Consult  Pharmacy Consult for:  Coumadin/Heparin Indication:  Atrial fibrillation and mechanical mitral valve  Allergies  Allergen Reactions  . Morphine And Related Itching and Rash   Patient Measurements: Height: 5\' 2"  (157.5 cm) Weight: 153 lb 9.6 oz (69.673 kg) IBW/kg (Calculated) : 50.1  Heparin dosing weight: 57.6kg  Labs:  Basename 08/31/11 0355 08/30/11 0905 08/30/11 0405 08/29/11 1350  HGB -- -- -- 9.1*  HCT -- -- -- 27.9*  PLT -- -- -- 290  APTT -- -- -- --  LABPROT 16.2* 19.3* 20.9* --  INR 1.27 1.60* 1.77* --  HEPARINUNFRC -- -- -- --  CREATININE -- -- -- 1.44*  CKTOTAL -- -- -- --  CKMB -- -- -- --  TROPONINI -- -- -- --    Estimated Creatinine Clearance: 27.1 ml/min (by C-G formula based on Cr of 1.44).   Medical History: Past Medical History  Diagnosis Date  . Permanent atrial fibrillation   . Dementia     mild  . Bradycardia     s/p PPM  . DJD (degenerative joint disease)     ambulates by wheelchair  . Diastolic dysfunction   . Venous stasis ulcer     L leg, slow healing  . Hypertension   . Renal disorder   . Coronary artery disease   . Constipation   . History of kidney stones    Medications:  Scheduled:     . ciprofloxacin  250 mg Oral BID  . docusate sodium  100 mg Oral Daily  . metoprolol succinate  100 mg Oral Daily  . oxyCODONE  10 mg Oral Q12H  . polyethylene glycol  17 g Oral Daily  . potassium chloride  10 mEq Oral Daily  . warfarin  2 mg Oral Once  . Warfarin - Pharmacist Dosing Inpatient   Does not apply q1800   Assessment:  78 year-old female with atrial fibrillation and mechanical mitral valve, home coumadin dose 4mg  daily.  Admitted on 6/28 with a supratherapeutic INR above 10 and GI bleeding.    After receiving FFP and Vitamin K 10 mg po, the INR was reported as 1.6 on 6/29.    Coumadin resumed last pm, 2mg  given  INR down to 1.27 this am, now ordered heparin bridge  CBC  today pending   Goal of Therapy:  INR 2.5-3.5 as indicated in Dr. Harriet Pho note of 08/29/11 Heparin levels 0.3-0.7 Monitoring platelet count per protocol: Yes   Plan:   No heparin bolus due to recent GI bleeding  Heparin 800 units/hr (~14 units/kg/hr)  Heparin level in 8hrs due to elevated Scr  Coumadin 4 mg dose tonight  Follow daily PT/INR, CBC, Heparin level  Loralee Pacas, PharmD, BCPS Pager: [phone] 08/31/2011,8:11 AM   Addendum: Spoke with Dr. Arthor Captain regarding Hg 6.3, reconsidering initiation of heparin.  Hold for now, will update after discussion with palliative/family.  Loralee Pacas, PharmD, BCPS Pager: [phone] 08/31/2011 9:44 AM

## 2011-09-01 DIAGNOSIS — K625 Hemorrhage of anus and rectum: Principal | ICD-10-CM

## 2011-09-01 DIAGNOSIS — N179 Acute kidney failure, unspecified: Secondary | ICD-10-CM

## 2011-09-01 DIAGNOSIS — I4891 Unspecified atrial fibrillation: Secondary | ICD-10-CM

## 2011-09-01 DIAGNOSIS — R627 Adult failure to thrive: Secondary | ICD-10-CM

## 2011-09-01 DIAGNOSIS — D62 Acute posthemorrhagic anemia: Secondary | ICD-10-CM

## 2011-09-01 DIAGNOSIS — M2550 Pain in unspecified joint: Secondary | ICD-10-CM

## 2011-09-01 LAB — TYPE AND SCREEN
ABO/RH(D): A POS
Antibody Screen: NEGATIVE
Unit division: 0
Unit division: 0

## 2011-09-01 LAB — CBC
HCT: 27.3 % — ABNORMAL LOW (ref 36.0–46.0)
Hemoglobin: 8.8 g/dL — ABNORMAL LOW (ref 12.0–15.0)
MCH: 29.1 pg (ref 26.0–34.0)
MCHC: 32.2 g/dL (ref 30.0–36.0)

## 2011-09-01 MED ORDER — WARFARIN SODIUM 4 MG PO TABS
4.0000 mg | ORAL_TABLET | Freq: Once | ORAL | Status: AC
  Administered 2011-09-01: 4 mg via ORAL
  Filled 2011-09-01: qty 1

## 2011-09-01 NOTE — Progress Notes (Signed)
DAILY PROGRESS NOTE                              [facility]AL [facility]NAL [facility]IC[facility]  SUBJECTIVE: Daughter at bedside. No evidence of overt bleeding.  OBJECTIVE: BP 126/64  Pulse 94  Temp 98.6 F (37 C) (Oral)  Resp 18  Ht 5\' 2"  (1.575 m)  Wt 69.673 kg (153 lb 9.6 oz)  BMI 28.09 kg/m2  SpO2 94%  Intake/Output Summary (Last 24 hours) at 09/01/11 1140 Last data filed at 09/01/11 0545  Gross per 24 hour  Intake   1250 ml  Output    551 ml  Net    699 ml                      Weight change:  Physical Exam: General: [name] not in any acute distress. HEENT: anicteric sclera, pupils equal reactive to light and accommodation CVS: S1-S2 heard, no murmur rubs or gallops Chest: clear to auscultation bilaterally, no wheezing rales or rhonchi Abdomen:  normal bowel sounds, soft, nontender, nondistended, no organomegaly Neuro: Cranial nerves II-XII intact, no focal neurological deficits Extremities: no cyanosis, no clubbing or edema noted bilaterally   Lab Results:  Basename 08/29/11 1350  NA 141  K 4.2  CL 103  CO2 26  GLUCOSE 145*  BUN 54*  CREATININE 1.44*  CALCIUM 8.9  MG --  PHOS --    Basename 08/29/11 1350  AST 15  ALT 7  ALKPHOS 86  BILITOT 0.6  PROT 6.5  ALBUMIN 2.9*   No results found for this basename: LIPASE:2,AMYLASE:2 in the last 72 hours  Basename 09/01/11 0345 08/31/11 0830  WBC 9.5 8.8  NEUTROABS -- --  HGB 8.8* 6.3*  HCT 27.3* 20.2*  MCV 90.4 91.0  PLT 243 215   No results found for this basename: CKTOTAL:3,CKMB:3,CKMBINDEX:3,TROPONINI:3 in the last 72 hours No components found with this basename: POCBNP:3 No results found for this basename: DDIMER:2 in the last 72 hours No results found for this basename: HGBA1C:2 in the last 72 hours No results found for this basename: CHOL:2,HDL:2,LDLCALC:2,TRIG:2,CHOLHDL:2,LDLDIRECT:2 in the last 72 hours No results found for this basename: TSH,T4TOTAL,FREET3,T3FREE,THYROIDAB in the last 72  hours No results found for this basename: VITAMINB12:2,FOLATE:2,FERRITIN:2,TIBC:2,IRON:2,RETICCTPCT:2 in the last 72 hours  Micro Results: Recent Results (from the past 240 hour(s))  URINE CULTURE     Status: Normal (Preliminary result)   Collection Time   08/30/11  6:00 AM      Component Value Range Status Comment   Specimen Description URINE, CATHETERIZED   Final    Special Requests NONE   Final    Culture  Setup Time 08/30/2011 11:11   Final    Colony Count 55,000 COLONIES/ML   Final    Culture ENTEROCOCCUS SPECIES   Final    Report Status PENDING   Incomplete     Studies/Results: No results found. Medications: Scheduled Meds:    . ciprofloxacin  250 mg Oral BID  . docusate sodium  100 mg Oral Daily  . metoprolol succinate  100 mg Oral Daily  . oxyCODONE  10 mg Oral Q12H  . polyethylene glycol  17 g Oral Daily  . potassium chloride  10 mEq Oral Daily  . warfarin  4 mg Oral ONCE-1800  . warfarin  4 mg Oral ONCE-1800  . Warfarin - Pharmacist Dosing Inpatient   Does not apply q1800   Continuous Infusions:    .  sodium chloride 75 mL/hr at 08/30/11 0603   PRN Meds:.acetaminophen, diphenhydrAMINE, ondansetron (ZOFRAN) IV, ondansetron, oxyCODONE-acetaminophen  ASSESSMENT & PLAN: Active Problems:  UTI  Dementia  Supratherapeutic INR  H/O mitral valve replacement with mechanical valve  Permanent atrial fibrillation  BRBPR   Acute renal failure   Rectal bleeding -This is likely secondary to supratherapeutic INR, seems like it's stopped. -Daughter is okay with transfusion of blood products, she does not want any invasive testing such as colonoscopy or endoscopy. -Will correct the INR and follow the hemoglobin.  Supratherapeutic INR -Patient is on chronic anticoagulation for mechanical mitral valve/atrial fibrillation. -Her INR reported to be more than 10 the time of admission, this is was repeated to verify the lab results. -Status post transfusion of 2 units of  FFP's and administration of vitamin K. -INR corrected to 1.9 the very next day, not sure if the initial INR of more than 10 is correct result.  Acute blood loss anemia -Patient does have chronic anemia, worsened by the acute blood loss now. Baseline hemoglobin about 10. -Hemoglobin today is 6.3, all transfuse 2 units of packed RBCs. Had good response to transfusion hemoglobin is over 8  UTI -Patient was recently being treated for UTI with ciprofloxacin. -This is likely what caused the interaction with Coumadin and resulted in supratherapeutic INR.  Permanent atrial fibrillation -She has symptomatic bradycardia with the atrial fibrillation, status post placement of permanent pacemaker. -Her rate is controlled, patient supposed to be on Coumadin.  Mitral mechanical valve -INR today is 1.29, wanted to start patient on heparin drip because of the low INR. -The same time patient has concurrent GI bleed, hemoglobin of 6.3 down from 10. -Patient is an active hospice patient, with this difficult situation of the anticoagulation I will consult palliative medicine. -Await palliative medicine service for goals of care.   LOS: 3 days   Earl Zellmer A 09/01/2011, 11:40 AM

## 2011-09-01 NOTE — Progress Notes (Signed)
Room 1326 - Sherry Howell  [facility]  [facility] RN Visit  Related admission to [facility] diagnosis of CHF.  Pt is [name] code with OOF [name].    Pt alert, confused with picking and attempting to get out of bed.  Student RN's working with pt - staff RN applied padding bandage to Left heel due to rubbing - Stage I.  Pt lying in bed, denies any pain or discomfort.    No family present.    PMT consult for GOC ordered.  Scheduled via phone call to dtr Dois Davenport [phone] for today 09/01/2011 @ 4pm.  [facility] SW also contacted via text and will be present also.  Please call [facility] @ [phone] with any hospice needs.  Thank you.  Joneen Boers, RN  [facility]  Hospice Liaison

## 2011-09-01 NOTE — Progress Notes (Signed)
Room 1326, Sherry Howell, [facility] of [place], [place] note  SW met with Palliative Care Dr. Derenda Mis, dtr's Sherry Howell and Sherry Howell, to discuss plan of care. Dtr's are in agreement to focus on comfort care only,discontinue coumadin and no further transfusions.  Pt moving to [facility] was discussed and this is family's first choice.  Family is going to [employer] to tour and will discuss following day with SW their wishes.  SW called [facility] and there is no bed available at this time.  Family's second choice is to return to [facility], knowing pt needs a higher level of care.  Call this SW at [phone] for any plans to discharge.  Burna Cash, LCSW [facility]

## 2011-09-01 NOTE — Progress Notes (Signed)
Chaplain's Note: Upon arrival, pt was asleep.  Pt briefly roused when called by name; pt denied having pain and nodded yes for having prayer.  Chaplain provided pastoral presence, pastoral reassurance and had prayer blessings, comfort and peace for pt/family.  Pt's daughters- Dois Davenport and Annabelle Harman arrived after visit outside of room.  Chaplain did follow-up with family. Willa S. Manson Passey, M.Div., [facility]

## 2011-09-01 NOTE — Progress Notes (Signed)
ANTICOAGULATION CONSULT NOTE - follow-up  Pharmacy Consult for: Coumadin Indication:  Atrial fibrillation and mechanical mitral valve  Allergies  Allergen Reactions  . Morphine And Related Itching and Rash   Patient Measurements: Height: 5\' 2"  (157.5 cm) Weight: 153 lb 9.6 oz (69.673 kg) IBW/kg (Calculated) : 50.1  Heparin dosing weight: 57.6kg  Labs:  Basename 09/01/11 0345 08/31/11 0830 08/31/11 0355 08/30/11 0905 08/29/11 1350  HGB 8.8* 6.3* -- -- --  HCT 27.3* 20.2* -- -- 27.9*  PLT 243 215 -- -- 290  APTT -- -- -- -- --  LABPROT 14.9 -- 16.2* 19.3* --  INR 1.15 -- 1.27 1.60* --  HEPARINUNFRC -- -- -- -- --  CREATININE -- -- -- -- 1.44*  CKTOTAL -- -- -- -- --  CKMB -- -- -- -- --  TROPONINI -- -- -- -- --    Estimated Creatinine Clearance: 27.1 ml/min (by C-G formula based on Cr of 1.44).   Medical History: Past Medical History  Diagnosis Date  . Permanent atrial fibrillation   . Dementia     mild  . Bradycardia     s/p PPM  . DJD (degenerative joint disease)     ambulates by wheelchair  . Diastolic dysfunction   . Venous stasis ulcer     L leg, slow healing  . Hypertension   . Renal disorder   . Coronary artery disease   . Constipation   . History of kidney stones    Medications:  Scheduled:     . ciprofloxacin  250 mg Oral BID  . docusate sodium  100 mg Oral Daily  . metoprolol succinate  100 mg Oral Daily  . oxyCODONE  10 mg Oral Q12H  . polyethylene glycol  17 g Oral Daily  . potassium chloride  10 mEq Oral Daily  . warfarin  4 mg Oral ONCE-1800  . Warfarin - Pharmacist Dosing Inpatient   Does not apply q1800   Assessment:  78 year-old female with atrial fibrillation and mechanical mitral valve, home coumadin dose 4mg  daily.  Admitted on 6/28 with a supratherapeutic INR above 10 and GI bleeding.    After receiving FFP and Vitamin K 10 mg po, the INR was reported as 1.6 on 6/29.    Coumadin resumed 6/29. INR remains  subtherapeutic.  Heparin bridging was cancelled d/t low hgb, now improved after transfusion.  Palliative care to consult with hospice team from prior to admission.  Concurrent Cipro can increase sensitivity to Coumadin.  Goal of Therapy:  INR 2.5-3.5 as indicated in Dr. Harriet Pho note of 08/29/11 Monitoring platelet count per protocol: Yes   Plan:   Repeat Coumadin 4 mg dose tonight.   Will likely be resistant for a few days after receiving Vit. K.  Follow daily labs and long-term anticoag plans.  Charolotte Eke, PharmD, pager [phone]. 09/01/2011,10:56 AM.

## 2011-09-01 NOTE — Consult Note (Signed)
Consult Note from the Palliative Medicine Team at [facility] Patient ZO:XWRUEAVWUJ KAMPBELL HOLAWAY      DOB: 04-13-1925      WJX:[id number]   Consult Requested by: Dr. Arthor Captain     PCP: Benita Stabile, MD Reason for Consultation:GOC    Phone Number:[phone] Related symptom recommendations Assessment and Plan: 78 year old white female with advanced dementia presented from ALF with bright red blood per rectum.  Patient found to have elevated INR, treated with reversal and transfusion. Patient also has a UTI.  Patient is reportedly bed bound unable to care for herself. Her daughters have decided to pursue full comfort which means to them comfort feeding, no further blood transfusions,  No further coumadin with symptom management should her valve become compromised or she has a stroke.  The patient has only been drinking liquids for some time. 1. Code Status: [name] full comfort care 2. Symptom Control: 1. Severe chronic arthritis: continue Percocet if patient able and willing to take this otherwise , switch to Oxyir 20mg /ml (30 ml bottle) 5 mg PO q 4hrs prn 2. Mechanical Valve:  Family has elected to not continue to worry about InR and ups and related downs even at low dose .  They are ready to embrace full comfort care with just treatment of symptoms.  I have discontinued next dose of Coumadin and related labs. 3. Chronic UTI; complete the current antibiotics and treat symptomatically in the future. 4. Hypokalemia discontinue potassium since we will not be checking labs 5. Failure to thrive secondary to advanced dementia and chronic illness. 3. Psycho/Social: described as quiet and unassuming. She never learned to drive. 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 4. Spiritual:  Christian woman who did  Rely on her faith. 5. Disposition: Without anticoagulation patient will likely clot off her valve in near future.  Suspect prognosis of days to weeks.  Prepared  family for possible acute event.  Residential placement is being prepared. If patient not lists her diagnoses for residential hospice and hospice at a facility would be most appropriate.  Patient Documents Completed or Given: Document Given Completed  Advanced Directives Pkt    MOST    [name]    Gone from My Sight    Hard Choices      Brief HPI: Patient is an 78 year old white female with advanced dementia admitted to hospice for a history of congestive heart failure.. Patient presented to the hospital with bright red blood per rectum secondary to an increased INR related to her Coumadin. Patient has a mechanical valve which requires anticoagulation, however, at this time is contraindicated because of her active bleeding. We are asked to do both care with the patient's family to determine what further treatments and goals would be.   ROS: Unable secondary to patient's dementia    PMH:  Past Medical History  Diagnosis Date  . Permanent atrial fibrillation   . Dementia     mild  . Bradycardia     s/p PPM  . DJD (degenerative joint disease)     ambulates by wheelchair  . Diastolic dysfunction   . Venous stasis ulcer     L leg, slow healing  . Hypertension   . Renal disorder   . Coronary artery disease   . Constipation   . History of kidney stones      PSH: Past Surgical History  Procedure Date  . Pacemaker insertion 04/11/04    MDT implanted by Dr  Edmunds  . Mitral valve replacement 04/03/04    Mitral valve replacement with a 24 mm mechanical ATS valve.  . Cholecystectomy   . Appendectomy   . Laminectomy   . Abdominal hysterectomy    I have reviewed the FH and SH and  If appropriate update it with new information. Allergies  Allergen Reactions  . Morphine And Related Itching and Rash   Scheduled Meds:   . ciprofloxacin  250 mg Oral BID  . docusate sodium  100 mg Oral Daily  . metoprolol succinate  100 mg Oral Daily  . oxyCODONE  10 mg Oral Q12H  . polyethylene  glycol  17 g Oral Daily  . potassium chloride  10 mEq Oral Daily  . warfarin  4 mg Oral ONCE-1800  . DISCONTD: Warfarin - Pharmacist Dosing Inpatient   Does not apply q1800   Continuous Infusions:   . sodium chloride 75 mL/hr at 08/30/11 0603   PRN Meds:.acetaminophen, diphenhydrAMINE, ondansetron (ZOFRAN) IV, ondansetron, oxyCODONE-acetaminophen    BP 126/64  Pulse 94  Temp 98.6 F (37 C) (Oral)  Resp 18  Ht 5\' 2"  (1.575 m)  Wt 69.673 kg (153 lb 9.6 oz)  BMI 28.09 kg/m2  SpO2 94%   PPS: 20-30%   Intake/Output Summary (Last 24 hours) at 09/01/11 2343 Last data filed at 09/01/11 2056  Gross per 24 hour  Intake    265 ml  Output    551 ml  Net   -286 ml   LBM:      09/01/2011              Physical Exam:  General: Generally a pleasantly confused white female with slight agitation at present but nothing that can't be redirected. She appears in no acute distress. She is able to speak but speaks nonsensically. She is unable to fully answer history questions but does confabulate her answers. HEENT:  Pupils are equal round reactive to light extra muscles appear intact his membranes are moist Chest:   Decreased but clear auscultation his orders we've CVS: Regular rate and rhythm with a 2/6 systolic ejection murmur Abdomen: Soft nontender nondistended with positive bowel sounds Ext: Severe osteoarthritis of the bilateral knees with decreased range of motion Neuro: Confused with confabulation and consistently follows requests  Labs: CBC    Component Value Date/Time   WBC 9.5 09/01/2011 0345   RBC 3.02* 09/01/2011 0345   HGB 8.8* 09/01/2011 0345   HCT 27.3* 09/01/2011 0345   PLT 243 09/01/2011 0345   MCV 90.4 09/01/2011 0345   MCH 29.1 09/01/2011 0345   MCHC 32.2 09/01/2011 0345   RDW 15.6* 09/01/2011 0345   LYMPHSABS 0.7 07/04/2011 1720   MONOABS 0.6 07/04/2011 1720   EOSABS 0.2 07/04/2011 1720   BASOSABS 0.0 07/04/2011 1720    CMP     Component Value Date/Time   NA 141 08/29/2011 1350     K 4.2 08/29/2011 1350   CL 103 08/29/2011 1350   CO2 26 08/29/2011 1350   GLUCOSE 145* 08/29/2011 1350   BUN 54* 08/29/2011 1350   CREATININE 1.44* 08/29/2011 1350   CALCIUM 8.9 08/29/2011 1350   PROT 6.5 08/29/2011 1350   ALBUMIN 2.9* 08/29/2011 1350   AST 15 08/29/2011 1350   ALT 7 08/29/2011 1350   ALKPHOS 86 08/29/2011 1350   BILITOT 0.6 08/29/2011 1350   GFRNONAA 33* 08/29/2011 1350   GFRAA 38* 08/29/2011 1350       Time In Time Out Total Time Spent  with Patient Total Overall Time  415 pm 545 pm 25 min 90 min    Greater than 50%  of this time was spent counseling and coordinating care related to the above assessment and plan.    Bonner Larue L. Ladona Ridgel, MD MBA The Palliative Medicine Team at [facility] Phone: [phone] Pager: [phone]

## 2011-09-01 NOTE — Progress Notes (Signed)
INITIAL ADULT NUTRITION ASSESSMENT Date: 09/01/2011   Time: 11:35 AM Reason for Assessment: Low Braden   ASSESSMENT: Female 78 y.o.  Dx: Rectal bleeding  Hx:  Past Medical History  Diagnosis Date  . Permanent atrial fibrillation   . Dementia     mild  . Bradycardia     s/p PPM  . DJD (degenerative joint disease)     ambulates by wheelchair  . Diastolic dysfunction   . Venous stasis ulcer     L leg, slow healing  . Hypertension   . Renal disorder   . Coronary artery disease   . Constipation   . History of kidney stones     Related Meds:  Scheduled Meds:   . ciprofloxacin  250 mg Oral BID  . docusate sodium  100 mg Oral Daily  . metoprolol succinate  100 mg Oral Daily  . oxyCODONE  10 mg Oral Q12H  . polyethylene glycol  17 g Oral Daily  . potassium chloride  10 mEq Oral Daily  . warfarin  4 mg Oral ONCE-1800  . warfarin  4 mg Oral ONCE-1800  . Warfarin - Pharmacist Dosing Inpatient   Does not apply q1800   Continuous Infusions:   . sodium chloride 75 mL/hr at 08/30/11 0603   PRN Meds:.acetaminophen, diphenhydrAMINE, ondansetron (ZOFRAN) IV, ondansetron, oxyCODONE-acetaminophen   Ht: 5\' 2"  (157.5 cm)  Wt: 153 lb 9.6 oz (69.673 kg)  Ideal Wt: 50.1 kg  % Ideal Wt: 139% Wt Readings from Last 10 Encounters:  08/29/11 153 lb 9.6 oz (69.673 kg)   Body mass index is 28.09 kg/(m^2). (Overweight)  Food/Nutrition Related Hx: Family present at time of RD visit. Per family patient would benefit from softer foods, patient has difficulty swallowing. Patient with poor PO intake of < 25% at meals.   Labs:  CMP     Component Value Date/Time   NA 141 08/29/2011 1350   K 4.2 08/29/2011 1350   CL 103 08/29/2011 1350   CO2 26 08/29/2011 1350   GLUCOSE 145* 08/29/2011 1350   BUN 54* 08/29/2011 1350   CREATININE 1.44* 08/29/2011 1350   CALCIUM 8.9 08/29/2011 1350   PROT 6.5 08/29/2011 1350   ALBUMIN 2.9* 08/29/2011 1350   AST 15 08/29/2011 1350   ALT 7 08/29/2011 1350   ALKPHOS 86 08/29/2011 1350   BILITOT 0.6 08/29/2011 1350   GFRNONAA 33* 08/29/2011 1350   GFRAA 38* 08/29/2011 1350    Intake/Output Summary (Last 24 hours) at 09/01/11 1140 Last data filed at 09/01/11 0545  Gross per 24 hour  Intake   1250 ml  Output    551 ml  Net    699 ml    er: General  Supplements/Tube Feeding: none at this time  IVF:    sodium chloride Last Rate: 75 mL/hr at 08/30/11 0603    Estimated Nutritional Needs:   Kcal: 1000-1200 Protein: 76.5-90.4 Fluid: 1 ml per kcal intake  NUTRITION DIAGNOSIS: -Inadequate oral intake (NI-2.1).  Status: Ongoing  RELATED TO: altered mental status and difficulty chewing  AS EVIDENCE BY: PO intake < 25% at meals.   MONITORING/EVALUATION(Goals): PO intake, weights, labs 1. PO intake > 75% at meals and supplements.   EDUCATION NEEDS: -No education needs identified at this time  INTERVENTION: 1. Will change diet to softer foods for ease of PO intake.  2. Will order patient magic cup BID.  3. RD to follow for nutrition plan of care.   Dietitian [phone]  DOCUMENTATION CODES Per  approved criteria  -Not Applicable    Iven Finn [facility] 09/01/2011, 11:35 AM

## 2011-09-02 DIAGNOSIS — M2550 Pain in unspecified joint: Secondary | ICD-10-CM

## 2011-09-02 DIAGNOSIS — R627 Adult failure to thrive: Secondary | ICD-10-CM

## 2011-09-02 LAB — URINE CULTURE

## 2011-09-02 NOTE — Progress Notes (Signed)
Room 1326 - Sherry Howell Pt will have [name] bed on Thursday 7/4.  H&P and MAR faxed to [facility] this am. Please call [facility] @ [phone] with any questions.  Thank you. Chalmers Cater, RN - [facility] Liaison

## 2011-09-02 NOTE — Progress Notes (Signed)
Room 1326 - Sherry Howell  - [facility] RN Visit  Related admission to [name] diagnosis of CHF.  Pt is [name] code with OOF [name] on shadow chart.    Pt alert, picking at stuffed cow on bed, attempting to throw legs over pillows off side of bed, confused, lying in bed, with no complaints of pain or discomfort.    No family present.    Pt has [facility] bed offer for Thursday, July 4th 2013.  H&P and MAR faxed to [facility].  Will need DC summary faxed to [facility] on Thursday.    Please call [facility] @ [phone] with any hospice needs.  Thank you.  Joneen Boers, RN  [name]  Hospice Liaison

## 2011-09-03 DIAGNOSIS — Z954 Presence of other heart-valve replacement: Secondary | ICD-10-CM

## 2011-09-03 DIAGNOSIS — R791 Abnormal coagulation profile: Secondary | ICD-10-CM

## 2011-09-03 NOTE — Progress Notes (Signed)
Subjective: Pleasantly confused; no CP, no SOB, no acute complaints. Afebrile.  Objective: Vital signs in last 24 hours: Temp:  [97.8 F (36.6 C)-98.9 F (37.2 C)] 98.9 F (37.2 C) (07/03 1425) Pulse Rate:  [69-111] 111  (07/03 1425) Resp:  [17-18] 18  (07/03 1425) BP: (105-126)/(66-68) 126/68 mmHg (07/03 1425) SpO2:  [18 %-98 %] 18 % (07/03 1425) Weight change:  Last BM Date: 09/03/11  Intake/Output from previous day: 07/02 0701 - 07/03 0700 In: 240 [P.O.:240] Out: 200 [Urine:200] Total I/O In: 600 [P.O.:600] Out: 250 [Urine:250]   Physical Exam: General: Alert, awake, oriented x1, in no acute distress. HEENT: No bruits, no goiter. Heart: S1 and S2; valve click appreciated. Lungs: Clear to auscultation bilaterally. Abdomen: Soft, nontender, nondistended, positive bowel sounds. Extremities: No clubbing or cyanosis; trace edema bilaterally. Neuro: Grossly intact, No new deficit.  CBC:  Basename 09/01/11 0345  WBC 9.5  NEUTROABS --  HGB 8.8*  HCT 27.3*  MCV 90.4  PLT 243   Coagulation:  Basename 09/01/11 0345  LABPROT 14.9  INR 1.15   Misc. Labs:  Recent Results (from the past 240 hour(s))  URINE CULTURE     Status: Normal   Collection Time   08/30/11  6:00 AM      Component Value Range Status Comment   Specimen Description URINE, CATHETERIZED   Final    Special Requests NONE   Final    Culture  Setup Time 08/30/2011 11:11   Final    Colony Count 55,000 COLONIES/ML   Final    Culture ENTEROCOCCUS SPECIES   Final    Report Status 09/02/2011 FINAL   Final    Organism ID, Bacteria ENTEROCOCCUS SPECIES   Final     Studies/Results: No results found.  Medications: Scheduled Meds:   . ciprofloxacin  250 mg Oral BID  . docusate sodium  100 mg Oral Daily  . metoprolol succinate  100 mg Oral Daily  . oxyCODONE  10 mg Oral Q12H  . polyethylene glycol  17 g Oral Daily   Continuous Infusions:   . sodium chloride 75 mL/hr at 08/30/11 0603   PRN  Meds:.acetaminophen, diphenhydrAMINE, ondansetron (ZOFRAN) IV, ondansetron, oxyCODONE-acetaminophen  Assessment/Plan:  Active Problems:  UTI  Dementia  Supratherapeutic INR  H/O mitral valve replacement with mechanical valve  Permanent atrial fibrillation  BRBPR   Acute renal failure  Failure to thrive in adult  Pain in joint, multiple sites  Plan: -continue full comfort care. -No further anticoagulation and no further transfusion.  -Plan is to transfer patient to [facility] on 09/04/2011 to continue full comfort care.   LOS: 5 days   Landrie Beale [facility] [phone]  09/03/2011, 6:02 PM

## 2011-09-03 NOTE — Progress Notes (Signed)
Room 1326 - Sherry Howell -  [facility] RN Visit  Related admission to [facility] diagnosis of CHF.   Pt is [name] code with OOF [name] on shadow chart.    Pt alert, talkative, lying in bed, without complaints of pain or discomfort.   Dtr Dois Davenport present who states pt had a good night and has been very talkative.  Dtr very excited about transfer to [facility] 7/4 - happy that patient's dog will be able to visit also.    No MD eval for 7/2 on chart.   Please fax DC summary to [facility] @ [phone].  Please call [facility] @ [phone] with any hospice needs.  Thank you.  Joneen Boers, RN  [facility]  Hospice Liaison

## 2011-09-04 MED ORDER — OXYCODONE-ACETAMINOPHEN 5-325 MG PO TABS: 0.5000 | ORAL_TABLET | ORAL | Status: AC | PRN

## 2011-09-04 MED ORDER — ONDANSETRON HCL 4 MG PO TABS: 4.0000 mg | ORAL_TABLET | Freq: Four times a day (QID) | ORAL | Status: AC | PRN

## 2011-09-04 MED ORDER — OXYCODONE HCL 10 MG PO TB12: 10.0000 mg | ORAL_TABLET | Freq: Two times a day (BID) | ORAL | Status: AC

## 2011-09-04 MED ORDER — LORAZEPAM 1 MG PO TABS
1.0000 mg | ORAL_TABLET | Freq: Once | ORAL | Status: AC
  Administered 2011-09-04: 1 mg via ORAL
  Filled 2011-09-04: qty 1

## 2011-09-04 MED ORDER — MORPHINE SULFATE (CONCENTRATE) 20 MG/ML PO SOLN: 10.0000 mg | ORAL | Status: AC | PRN

## 2011-09-04 MED ORDER — LORAZEPAM 2 MG/ML IJ SOLN: INTRAMUSCULAR | Status: AC

## 2011-09-04 MED ORDER — DIPHENHYDRAMINE HCL 12.5 MG/5ML PO ELIX: 25.0000 mg | ORAL_SOLUTION | Freq: Three times a day (TID) | ORAL | Status: AC | PRN

## 2011-09-04 NOTE — Progress Notes (Signed)
Called report to Garth Bigness, [facility].  She verbalized understanding.

## 2011-09-04 NOTE — Discharge Summary (Signed)
Physician Discharge Summary  Patient ID: LUKE RIGSBEE MRN: [id number] DOB/AGE: 76-Feb-1927 76 y.o.  Admit date: 08/29/2011 Discharge date: 09/04/2011  Primary Care Physician:  Benita Stabile, MD   Discharge Diagnoses:   1-Enterococcus UTI 2-Supraterapeutic INR (> 10) 3-GI bleed (BRBPR) 4-Acute blood loss anemia 5-Dementia 6-Permanent atrial fibrillation 7-Acute renal failure 8-Failure to thrive in adult 9-Chronic arthritis with debilitating pain   Medication List  As of 09/04/2011  8:49 AM   STOP taking these medications         ciprofloxacin 250 MG tablet      furosemide 40 MG tablet      LORazepam 0.5 MG tablet      meloxicam 7.5 MG tablet      potassium chloride 10 MEQ tablet      warfarin 4 MG tablet         TAKE these medications         COLACE 100 MG capsule   Generic drug: docusate sodium   Take 100 mg by mouth daily.      dextromethorphan 30 MG/5ML liquid   Commonly known as: DELSYM   Take 30 mg by mouth every 4 (four) hours as needed. For cough.      diphenhydrAMINE 12.5 MG/5ML elixir   Commonly known as: BENADRYL   Take 10 mLs (25 mg total) by mouth every 8 (eight) hours as needed for itching or allergies.      LORazepam 2 MG/ML injection   Commonly known as: ATIVAN   2 MG SL every 2-3Hrs as needed for agitation, restlessness and comfort.      metoprolol succinate 100 MG 24 hr tablet   Commonly known as: TOPROL-XL   Take 100 mg by mouth daily. Take with or immediately following a meal.      morphine 20 MG/ML concentrated solution   Commonly known as: ROXANOL   Take 0.5 mLs (10 mg total) by mouth every 2 (two) hours as needed for pain (SOB).      ondansetron 4 MG tablet   Commonly known as: ZOFRAN   Take 1 tablet (4 mg total) by mouth every 6 (six) hours as needed for nausea.      oxyCODONE 10 MG 12 hr tablet   Commonly known as: OXYCONTIN   Take 1 tablet (10 mg total) by mouth every 12 (twelve) hours.     oxyCODONE-acetaminophen 5-325 MG per tablet   Commonly known as: PERCOCET   Take 0.5-1 tablets by mouth every 4 (four) hours as needed.      polyethylene glycol packet   Commonly known as: MIRALAX / GLYCOLAX   Take 17 g by mouth daily. For constipation.             Disposition and Follow-up:  After discussion with palliative care team and family members; and looking into patient poor quality of life and decline health status; the decision taken was to pursue full comfort care; stop anticoagulation; no further transfusions or curative treatments. Family understand risk of stroke and valve clot off w/o anticoagulation. Patient will be transferred to [facility] for further comfort care treatment. With new goals of care medication has been adjusted and tailor for symptomatic treatment.   Consults: Palliative care   Significant Diagnostic Studies:  No results found.   Brief H and P: For complete details please refer to admission H and P, but in brief 78 y.o. female with past medical history of permanent atrial fibrillation and mechanical mitral valve for which she  is on chronic anticoagulation. Patient has severe dementia, bedbound and nursing home resident. Patient was brought into the hospital due to Melanotic stools and subsequent BRBPR. Per records she was started on cipro as an outpatient for UTI and her coumadin was supposed to be stopped; in the ED INR was > 10 and patient was slightly more anemic than at her baseline. Despite been [name]/DNI; family agree's to admit for stabilization; blood transfusion and not invasive procedures.     Hospital Course:  Active Problems:  UTI  Dementia  Supratherapeutic INR  H/O mitral valve replacement with mechanical valve  Permanent atrial fibrillation  BRBPR   Acute renal failure  Failure to thrive in adult  Pain in joint, multiple sites  Plan: -Full comfort care -No more antibiotics and no intention for curative  interventions. -Comfort feeding -No more anticoagulation therapy. *Will be transferred to [facility] place for further symptomatic management and full comfort care.   Time spent on Discharge: 25 minutes  Signed: Arshdeep Bolger 09/04/2011, 8:49 AM

## 2012-01-02 DEATH — deceased

## 2012-04-27 ENCOUNTER — Encounter: Payer: Self-pay | Admitting: *Deleted

## 2012-05-20 ENCOUNTER — Telehealth: Payer: Self-pay | Admitting: Internal Medicine

## 2012-05-20 NOTE — Telephone Encounter (Signed)
New Problem:    Patient's daughter called in to let us know that her mother passed on 12-25-2011.

## 2012-09-30 IMAGING — CR DG CHEST 2V
2 series · 2 of 2 positions shown · non-contrast
Comparison: Chest x-ray 09/08/2009, chest CT 11/17/2006

CLINICAL DATA: Chest pain shortness of breath.  Altered mental
status.

CHEST - 2 VIEW

[x chest ap]
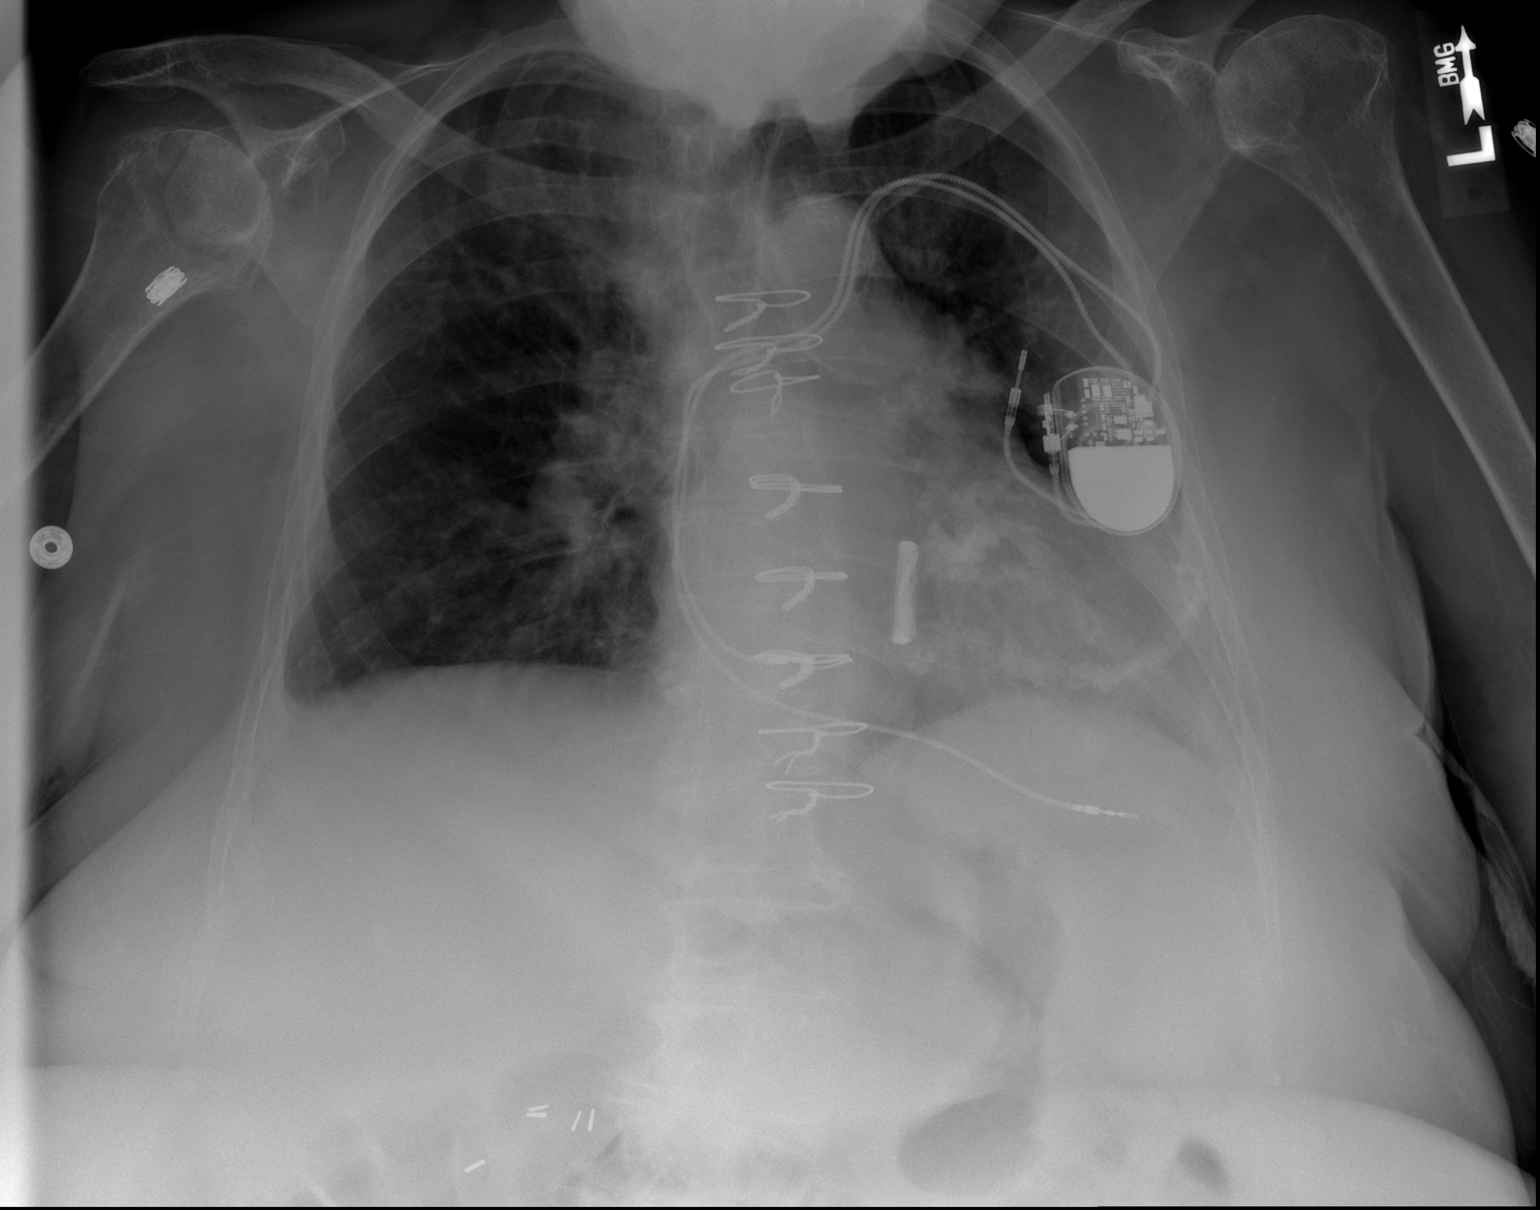

[w chest lat]
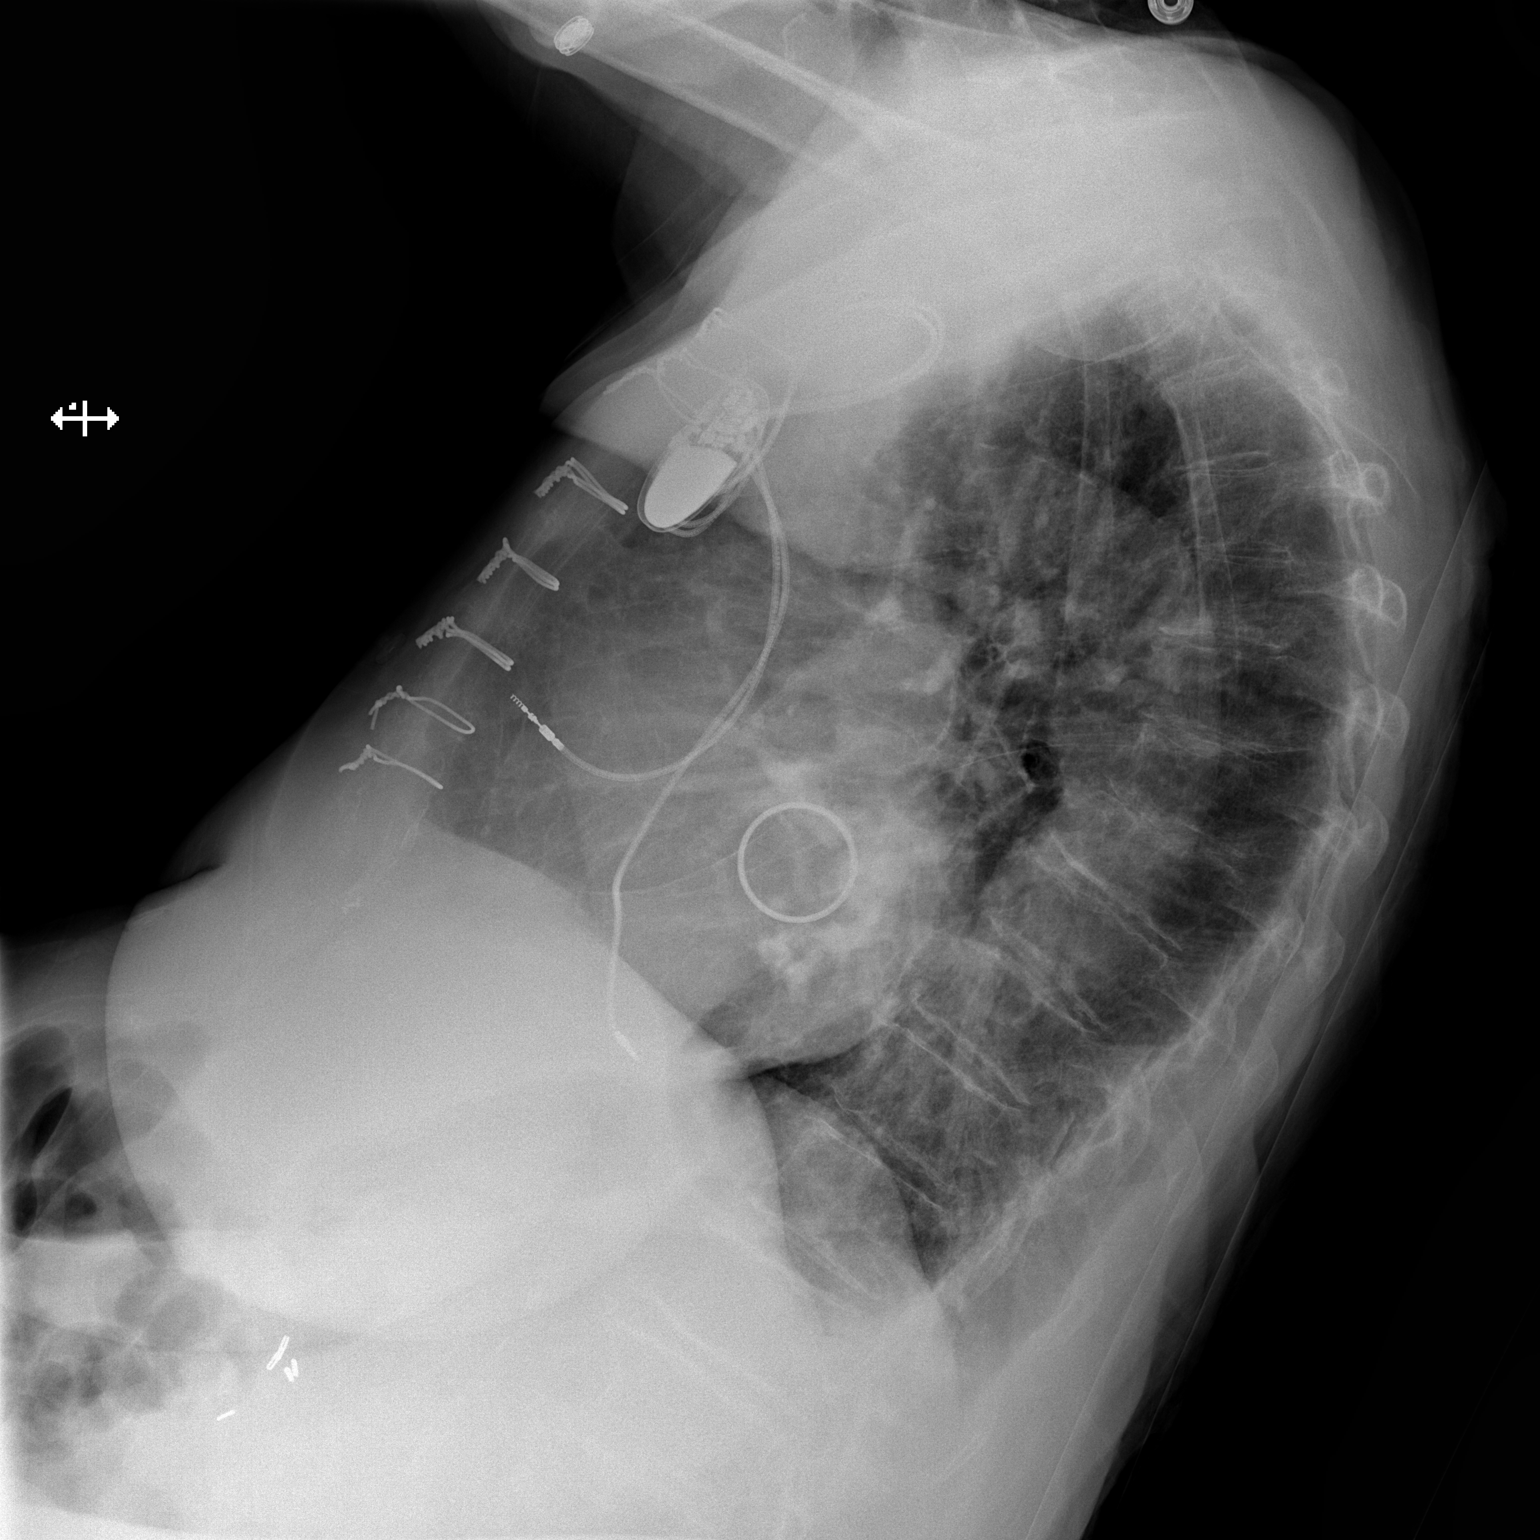

[2 of 2 positions shown; findings below may reference images not displayed]

FINDINGS: Patient has left-sided transvenous pacemaker, leads to
the right atrium and right ventricle. One of the two leads is not
connected to the device. This may represent an old lead as this
represents a new device per discussion with Dr. Jiang.

The heart is enlarged.  Patient has had mitral valve replacement.
There is significant calcification within the wall of the left
ventricle, as seen on previous exam.  No evidence for pulmonary
edema.  No focal consolidations. There is a small right pleural
effusion.  Surgical clips are present in the right upper quadrant
of the abdomen.
IMPRESSION: 1.  Cardiomegaly and ventricular wall calcifications.
2.  Pacemaker lead disengaged from the device.  See discussion
above.
3.  No evidence for pulmonary edema.

The findings were discussed with Dr. Jiang on 06/21/2011 at [DATE]
p.m..

## 2014-02-09 ENCOUNTER — Encounter (HOSPITAL_COMMUNITY): Payer: Self-pay | Admitting: Internal Medicine
